# Patient Record
Sex: Female | Born: 1975 | Race: White | Hispanic: No | Marital: Married | Smoking: Former smoker
Health system: Southern US, Community
[De-identification: ages and names within clinical notes are randomized; demographics above are authoritative.]

## PROBLEM LIST (undated history)

## (undated) DIAGNOSIS — Z8481 Family history of carrier of genetic disease: Secondary | ICD-10-CM

## (undated) DIAGNOSIS — F32A Depression, unspecified: Secondary | ICD-10-CM

## (undated) DIAGNOSIS — N979 Female infertility, unspecified: Secondary | ICD-10-CM

## (undated) DIAGNOSIS — Z8041 Family history of malignant neoplasm of ovary: Secondary | ICD-10-CM

## (undated) DIAGNOSIS — J309 Allergic rhinitis, unspecified: Secondary | ICD-10-CM

## (undated) DIAGNOSIS — Z8042 Family history of malignant neoplasm of prostate: Secondary | ICD-10-CM

## (undated) DIAGNOSIS — Z803 Family history of malignant neoplasm of breast: Secondary | ICD-10-CM

## (undated) DIAGNOSIS — O34219 Maternal care for unspecified type scar from previous cesarean delivery: Secondary | ICD-10-CM

## (undated) DIAGNOSIS — F329 Major depressive disorder, single episode, unspecified: Secondary | ICD-10-CM

## (undated) HISTORY — DX: Family history of malignant neoplasm of breast: Z80.3

## (undated) HISTORY — DX: Allergic rhinitis, unspecified: J30.9

## (undated) HISTORY — DX: Family history of malignant neoplasm of ovary: Z80.41

## (undated) HISTORY — DX: Family history of carrier of genetic disease: Z84.81

## (undated) HISTORY — DX: Female infertility, unspecified: N97.9

## (undated) HISTORY — DX: Family history of malignant neoplasm of prostate: Z80.42

## (undated) HISTORY — DX: Depression, unspecified: F32.A

## (undated) HISTORY — DX: Major depressive disorder, single episode, unspecified: F32.9

---

## 2003-01-05 ENCOUNTER — Other Ambulatory Visit: Admission: RE | Admit: 2003-01-05 | Discharge: 2003-01-05 | Payer: Self-pay | Admitting: Obstetrics and Gynecology

## 2003-07-13 ENCOUNTER — Other Ambulatory Visit: Admission: RE | Admit: 2003-07-13 | Discharge: 2003-07-13 | Payer: Self-pay | Admitting: Obstetrics and Gynecology

## 2003-10-12 ENCOUNTER — Other Ambulatory Visit: Admission: RE | Admit: 2003-10-12 | Discharge: 2003-10-12 | Payer: Self-pay | Admitting: Obstetrics and Gynecology

## 2004-03-21 ENCOUNTER — Other Ambulatory Visit: Admission: RE | Admit: 2004-03-21 | Discharge: 2004-03-21 | Payer: Self-pay | Admitting: Obstetrics and Gynecology

## 2005-01-24 ENCOUNTER — Other Ambulatory Visit: Admission: RE | Admit: 2005-01-24 | Discharge: 2005-01-24 | Payer: Self-pay | Admitting: Obstetrics and Gynecology

## 2005-06-20 ENCOUNTER — Other Ambulatory Visit: Admission: RE | Admit: 2005-06-20 | Discharge: 2005-06-20 | Payer: Self-pay | Admitting: Obstetrics and Gynecology

## 2007-11-04 ENCOUNTER — Emergency Department (HOSPITAL_COMMUNITY): Admission: EM | Admit: 2007-11-04 | Discharge: 2007-11-04 | Payer: Self-pay | Admitting: Emergency Medicine

## 2008-04-14 ENCOUNTER — Emergency Department (HOSPITAL_COMMUNITY): Admission: EM | Admit: 2008-04-14 | Discharge: 2008-04-14 | Payer: Self-pay | Admitting: Family Medicine

## 2008-07-16 HISTORY — PX: KNEE SURGERY: SHX244

## 2008-12-07 ENCOUNTER — Encounter: Admission: RE | Admit: 2008-12-07 | Discharge: 2008-12-07 | Payer: Self-pay | Admitting: Obstetrics and Gynecology

## 2009-01-06 ENCOUNTER — Ambulatory Visit: Payer: Self-pay | Admitting: Internal Medicine

## 2009-01-06 DIAGNOSIS — J309 Allergic rhinitis, unspecified: Secondary | ICD-10-CM | POA: Insufficient documentation

## 2009-01-06 DIAGNOSIS — J069 Acute upper respiratory infection, unspecified: Secondary | ICD-10-CM | POA: Insufficient documentation

## 2009-01-06 HISTORY — DX: Allergic rhinitis, unspecified: J30.9

## 2009-03-14 ENCOUNTER — Ambulatory Visit (HOSPITAL_COMMUNITY): Admission: RE | Admit: 2009-03-14 | Discharge: 2009-03-14 | Payer: Self-pay | Admitting: Obstetrics and Gynecology

## 2009-08-12 ENCOUNTER — Ambulatory Visit: Payer: Self-pay | Admitting: Internal Medicine

## 2010-01-25 ENCOUNTER — Encounter: Admission: RE | Admit: 2010-01-25 | Discharge: 2010-01-26 | Payer: Self-pay | Admitting: Obstetrics and Gynecology

## 2010-03-22 ENCOUNTER — Inpatient Hospital Stay (HOSPITAL_COMMUNITY): Admission: AD | Admit: 2010-03-22 | Discharge: 2010-03-22 | Payer: Self-pay | Admitting: Obstetrics and Gynecology

## 2010-03-25 ENCOUNTER — Inpatient Hospital Stay (HOSPITAL_COMMUNITY): Admission: AD | Admit: 2010-03-25 | Discharge: 2010-03-27 | Payer: Self-pay | Admitting: Obstetrics and Gynecology

## 2010-03-25 ENCOUNTER — Encounter (INDEPENDENT_AMBULATORY_CARE_PROVIDER_SITE_OTHER): Payer: Self-pay | Admitting: Obstetrics and Gynecology

## 2010-05-25 ENCOUNTER — Ambulatory Visit: Payer: Self-pay | Admitting: Internal Medicine

## 2010-07-01 ENCOUNTER — Ambulatory Visit: Payer: Self-pay | Admitting: Family Medicine

## 2010-08-15 NOTE — Assessment & Plan Note (Signed)
Summary: FLU SHOT // RS  Nurse Visit Flu Vaccine Consent Questions     Do you have a history of severe allergic reactions to this vaccine? no    Any prior history of allergic reactions to egg and/or gelatin? no    Do you have a sensitivity to the preservative Thimersol? no    Do you have a past history of Guillan-Barre Syndrome? no    Do you currently have an acute febrile illness? no    Have you ever had a severe reaction to latex? no    Vaccine information given and explained to patient? yes    Are you currently pregnant? no    Lot Number:[id number]   Exp Date:01/13/2011   Site Given  Left Deltoid IM   Vital Signs:  Patient profile:   35 year old female Temp:     98.1 degrees F oral  Vitals Entered By: Duard Brady LPN (May 25, 2010 2:33 PM)  Allergies: 1)  ! Latex Exam Gloves (Disposable Gloves)  Orders Added: 1)  Admin 1st Vaccine [90471] 2)  Flu Vaccine 58yrs + [16109]

## 2010-08-15 NOTE — Assessment & Plan Note (Signed)
Summary: sinus inf/headaches/congestion/cjr   Vital Signs:  Patient profile:   35 year old female Weight:      168 pounds BMI:     26.41 Temp:     97.9 degrees F oral BP sitting:   102 / 72  (left arm) Cuff size:   regular  Vitals Entered By: Raechel Ache, RN (August 12, 2009 11:03 AM) CC: C/o head congestion , facial pressure and is [redacted] weeks pregnant. Is Patient Diabetic? No   CC:  C/o head congestion  and facial pressure and is [redacted] weeks pregnant..  History of Present Illness: 35 year old patient at [redacted] weeks gestation, who presents with a several-day history of cough, congestion, and achiness.  Denies any fever, chills, or shortness of breath.  No significant sore throat.  She has been using Afrin and her  baseline antihistamine  Allergies: 1)  ! Latex Exam Gloves (Disposable Gloves)  Past History:  Past Medical History: Reviewed history from 01/06/2009 and no changes required. Allergic rhinitis infertility  Family History: Reviewed history from 01/06/2009 and no changes required. father age 52, hypertension mother, age 86, history of hypertension, and DJD 3 brothers and two sisters are in good health  Review of Systems       The patient complains of anorexia, fever, and prolonged cough.  The patient denies weight loss, weight gain, vision loss, decreased hearing, hoarseness, chest pain, syncope, dyspnea on exertion, peripheral edema, headaches, hemoptysis, abdominal pain, melena, hematochezia, severe indigestion/heartburn, hematuria, incontinence, genital sores, muscle weakness, suspicious skin lesions, transient blindness, difficulty walking, depression, unusual weight change, abnormal bleeding, enlarged lymph nodes, angioedema, and breast masses.    Physical Exam  General:  Well-developed,well-nourished,in no acute distress; alert,appropriate and cooperative throughout examination; normal blood pressure Head:  Normocephalic and atraumatic without obvious  abnormalities. No apparent alopecia or balding. Eyes:  No corneal or conjunctival inflammation noted. EOMI. Perrla. Funduscopic exam benign, without hemorrhages, exudates or papilledema. Vision grossly normal. Ears:  External ear exam shows no significant lesions or deformities.  Otoscopic examination reveals clear canals, tympanic membranes are intact bilaterally without bulging, retraction, inflammation or discharge. Hearing is grossly normal bilaterally. Mouth:  Oral mucosa and oropharynx without lesions or exudates.  Teeth in good repair. Neck:  No deformities, masses, or tenderness noted. Lungs:  Normal respiratory effort, chest expands symmetrically. Lungs are clear to auscultation, no crackles or wheezes.   Impression & Recommendations:  Problem # 1:  URI (ICD-465.9)  Her updated medication list for this problem includes:    Xyzal 5 Mg Tabs (Levocetirizine dihydrochloride) .Marland Kitchen... 1 once daily  Problem # 2:  ALLERGIC RHINITIS (ICD-477.9)  Her updated medication list for this problem includes:    Xyzal 5 Mg Tabs (Levocetirizine dihydrochloride) .Marland Kitchen... 1 once daily  Complete Medication List: 1)  Xyzal 5 Mg Tabs (Levocetirizine dihydrochloride) .Marland Kitchen.. 1 once daily 2)  Pre-natal Formula Tabs (Prenatal multivit-min-fe-fa) .Marland Kitchen.. 1 once daily 3)  First-progesterone Vgs 50 50 Mg Supp (Progesterone) .Marland Kitchen.. 1 once daily  Patient Instructions: 1)  Get plenty of rest, drink lots of clear liquids, and use Tylenol  for fever and comfort. Return in 7-10 days if you're not better:sooner if you're feeling worse.

## 2010-08-17 NOTE — Assessment & Plan Note (Signed)
Summary: SINUSITIS // RS   Vital Signs:  Patient profile:   35 year old female Weight:      164 pounds BMI:     25.78 Temp:     98.0 degrees F oral BP sitting:   100 / 68  (left arm)  Vitals Entered By: Doristine Devoid CMA (July 01, 2010 9:19 AM) CC: sinus pressure and R ear pain  Comments not suer what she could take since patient is breast feeding   History of Present Illness: Patient seen with one-week history of sinus pressure frontal sinus region. Right ear fullness. Mild body aches. Denies any fever, nausea, vomiting, or cough.  no sore throat.  no ill exposures. She is breast-feeding 58-month-old.  Current Medications (verified): 1)  Xyzal 5 Mg Tabs (Levocetirizine Dihydrochloride) .Marland Kitchen.. 1 Once Daily 2)  Pre-Natal Formula  Tabs (Prenatal Multivit-Min-Fe-Fa) .Marland Kitchen.. 1 Once Daily 3)  First-Progesterone Vgs 50 50 Mg Supp (Progesterone) .Marland Kitchen.. 1 Once Daily  Allergies (verified): 1)  ! Latex Exam Gloves (Disposable Gloves)  Past History:  Past Medical History: Last updated: 01/06/2009 Allergic rhinitis infertility  Physical Exam  General:  Well-developed,well-nourished,in no acute distress; alert,appropriate and cooperative throughout examination Ears:  External ear exam shows no significant lesions or deformities.  Otoscopic examination reveals clear canals, tympanic membranes are intact bilaterally without bulging, retraction, inflammation or discharge. Hearing is grossly normal bilaterally. Nose:  External nasal examination shows no deformity or inflammation. Nasal mucosa are pink and moist without lesions or exudates. Mouth:  Oral mucosa and oropharynx without lesions or exudates.  Teeth in good repair. Neck:  No deformities, masses, or tenderness noted. Lungs:  Normal respiratory effort, chest expands symmetrically. Lungs are clear to auscultation, no crackles or wheezes. Heart:  Normal rate and regular rhythm. S1 and S2 normal without gallop, murmur, click, rub or other  extra sounds.   Impression & Recommendations:  Problem # 1:  URI (ICD-465.9) suspect viral. Over-the-counter meds such as Tylenol or ibuprofen for symptomatic relief follow up p.r.n. Her updated medication list for this problem includes:    Xyzal 5 Mg Tabs (Levocetirizine dihydrochloride) .Marland Kitchen... 1 once daily  Complete Medication List: 1)  Xyzal 5 Mg Tabs (Levocetirizine dihydrochloride) .Marland Kitchen.. 1 once daily 2)  Pre-natal Formula Tabs (Prenatal multivit-min-fe-fa) .Marland Kitchen.. 1 once daily 3)  First-progesterone Vgs 50 50 Mg Supp (Progesterone) .Marland Kitchen.. 1 once daily   Orders Added: 1)  Est. Patient Level III [16109]

## 2010-08-22 ENCOUNTER — Encounter: Payer: Self-pay | Admitting: Internal Medicine

## 2010-08-22 ENCOUNTER — Ambulatory Visit (INDEPENDENT_AMBULATORY_CARE_PROVIDER_SITE_OTHER): Payer: BC Managed Care – PPO | Admitting: Internal Medicine

## 2010-08-22 VITALS — BP 110/68 | Temp 98.4°F | Ht 66.5 in | Wt 163.0 lb

## 2010-08-22 DIAGNOSIS — J069 Acute upper respiratory infection, unspecified: Secondary | ICD-10-CM

## 2010-08-22 MED ORDER — LEVOCETIRIZINE DIHYDROCHLORIDE 5 MG PO TABS: 5.0000 mg | ORAL_TABLET | Freq: Every evening | ORAL | Status: DC

## 2010-08-22 NOTE — Progress Notes (Signed)
  Subjective:    Patient ID: Gabriela Bean, female    DOB: March 28, 1976, 35 y.o.   MRN: [id number]  HPI    35 year old patient who presents with a 3-day history of URI symptoms.  She is breast-feeding.  A9 month old son.  She feels fairly well but is concerned about worsening cough, which seems to startle her son.  There's been no sputum production, fever, chest pain or shortness of breath.  She does have a history of allergic rhinitis, but presently is taking no medications.  Due to her recent pregnancy and breast-feeding status.Review of Systems  Constitutional: Negative.   HENT: Positive for congestion. Negative for hearing loss, sore throat, rhinorrhea, dental problem, sinus pressure and tinnitus.   Eyes: Negative for pain, discharge and visual disturbance.  Respiratory: Positive for cough. Negative for shortness of breath.   Cardiovascular: Negative for chest pain, palpitations and leg swelling.  Gastrointestinal: Negative for nausea, vomiting, abdominal pain, diarrhea, constipation, blood in stool and abdominal distention.  Genitourinary: Negative for dysuria, urgency, frequency, hematuria, flank pain, vaginal bleeding, vaginal discharge, difficulty urinating, vaginal pain and pelvic pain.  Musculoskeletal: Negative for joint swelling, arthralgias and gait problem.  Skin: Negative for rash.  Neurological: Negative for dizziness, syncope, speech difficulty, weakness, numbness and headaches.  Hematological: Negative for adenopathy. Does not bruise/bleed easily.  Psychiatric/Behavioral: Negative for behavioral problems, dysphoric mood and agitation. The patient is not nervous/anxious.        Objective:   Physical Exam  Constitutional: She is oriented to person, place, and time. She appears well-developed and well-nourished.  HENT:  Head: Normocephalic.  Right Ear: External ear normal.  Left Ear: External ear normal.  Nose: Nose normal.  Mouth/Throat: Oropharynx is clear and moist.  Eyes:  Conjunctivae and EOM are normal. Pupils are equal, round, and reactive to light.  Neck: Normal range of motion. Neck supple. No thyromegaly present.  Cardiovascular: Normal rate, regular rhythm, normal heart sounds and intact distal pulses.   Pulmonary/Chest: Effort normal and breath sounds normal.  Abdominal: Soft. Bowel sounds are normal. She exhibits no mass. There is no tenderness.  Musculoskeletal: Normal range of motion.  Lymphadenopathy:    She has no cervical adenopathy.  Neurological: She is alert and oriented to person, place, and time.  Skin: Skin is warm and dry. No rash noted.  Psychiatric: She has a normal mood and affect. Her behavior is normal.          Assessment & Plan:  Viral URI-  Patient symptoms are mild and the patient is breast-feeding.  She will use Tylenol and Robitussin as needed only.  A refill for xyzal  Dispensed But will not resume until after weaning

## 2010-08-22 NOTE — Patient Instructions (Signed)
Get plenty of rest, Drink lots of  clear liquids, and use Tylenol or ibuprofen for fever and discomfort.    Call or return to clinic prn if these symptoms worsen or fail to improve as anticipated.  

## 2010-09-28 LAB — CBC
HCT: 24.7 % — ABNORMAL LOW (ref 36.0–46.0)
HCT: 31.3 % — ABNORMAL LOW (ref 36.0–46.0)
Hemoglobin: 10.8 g/dL — ABNORMAL LOW (ref 12.0–15.0)
MCH: 30.4 pg (ref 26.0–34.0)
MCH: 31.7 pg (ref 26.0–34.0)
MCHC: 34.4 g/dL (ref 30.0–36.0)
MCV: 90.6 fL (ref 78.0–100.0)
Platelets: 177 10*3/uL (ref 150–400)
RBC: 2.72 MIL/uL — ABNORMAL LOW (ref 3.87–5.11)
RDW: 14.3 % (ref 11.5–15.5)
WBC: 11.5 10*3/uL — ABNORMAL HIGH (ref 4.0–10.5)

## 2010-09-28 LAB — GLUCOSE, RANDOM: Glucose, Bld: 103 mg/dL — ABNORMAL HIGH (ref 70–99)

## 2011-04-27 ENCOUNTER — Telehealth: Payer: Self-pay | Admitting: Internal Medicine

## 2011-04-27 ENCOUNTER — Encounter: Payer: Self-pay | Admitting: Internal Medicine

## 2011-04-27 ENCOUNTER — Ambulatory Visit (INDEPENDENT_AMBULATORY_CARE_PROVIDER_SITE_OTHER): Payer: BC Managed Care – PPO | Admitting: Internal Medicine

## 2011-04-27 VITALS — BP 110/70 | Temp 98.6°F | Wt 149.0 lb

## 2011-04-27 DIAGNOSIS — K529 Noninfective gastroenteritis and colitis, unspecified: Secondary | ICD-10-CM

## 2011-04-27 DIAGNOSIS — K5289 Other specified noninfective gastroenteritis and colitis: Secondary | ICD-10-CM

## 2011-04-27 MED ORDER — DIPHENOXYLATE-ATROPINE 2.5-0.025 MG PO TABS: 1.0000 | ORAL_TABLET | Freq: Four times a day (QID) | ORAL | Status: AC | PRN

## 2011-04-27 MED ORDER — PROMETHAZINE HCL 25 MG PO TABS: 25.0000 mg | ORAL_TABLET | Freq: Four times a day (QID) | ORAL | Status: AC | PRN

## 2011-04-27 NOTE — Progress Notes (Signed)
  Subjective:    Patient ID: Gabriela Bean, female    DOB: 16-Feb-1976, 35 y.o.   MRN: [id number]  HPI  35 year old patient who had the onset of nausea vomiting and diarrhea last night. No nausea has resolved and she is tolerating liquids without difficulty. Her crampy abdominal pain also has resolved. Her diarrhea has improved and she has basically a loose stool every one or 2 hours. She has had no fever in general she feels reasonably well since her nausea has resolved. Her chief concern was possible exposure with a 35-month-old child    Review of Systems  Constitutional: Positive for fatigue. Negative for fever and chills.  Gastrointestinal: Positive for diarrhea. Negative for abdominal pain and abdominal distention. Nausea: resolved. Vomiting:  resolved.       Objective:   Physical Exam  Constitutional: She appears well-developed and well-nourished. No distress.  Abdominal: Soft. Bowel sounds are normal. She exhibits no distension. There is no tenderness. There is no rebound and no guarding.          Assessment & Plan:    Resolving viral gastroenteritis.  Meticulous handwashing discussed. She will be treated symptomatically

## 2011-04-27 NOTE — Telephone Encounter (Signed)
Not at this time - will call if any cx - pt only from office

## 2011-04-27 NOTE — Telephone Encounter (Signed)
Pt is scheduled for 3:45 today has vomiting and Nausea and would like to know if they can be worked in sooner. Please contact pt

## 2011-04-27 NOTE — Patient Instructions (Addendum)
The main concern with gastroenteritis is dehydration.  Drink  plenty of  fluids, and advance your diet  slowly to solids as you feel improved.  If you are unable to keep anything down or  Show  signs of dehydration such as dry, cracked lips, or  not urinating, please notify our office.  ALIGN  One daily  Call or return to clinic prn if these symptoms worsen or fail to improve as anticipated.

## 2012-02-08 LAB — OB RESULTS CONSOLE GC/CHLAMYDIA
Chlamydia: NEGATIVE
Gonorrhea: NEGATIVE

## 2012-02-08 LAB — OB RESULTS CONSOLE RPR: RPR: NONREACTIVE

## 2012-02-08 LAB — OB RESULTS CONSOLE HIV ANTIBODY (ROUTINE TESTING): HIV: NONREACTIVE

## 2012-06-06 ENCOUNTER — Ambulatory Visit (INDEPENDENT_AMBULATORY_CARE_PROVIDER_SITE_OTHER): Payer: BC Managed Care – PPO | Admitting: Family Medicine

## 2012-06-06 ENCOUNTER — Encounter: Payer: Self-pay | Admitting: Family Medicine

## 2012-06-06 VITALS — BP 102/64 | HR 107 | Temp 98.1°F | Wt 164.0 lb

## 2012-06-06 DIAGNOSIS — J069 Acute upper respiratory infection, unspecified: Secondary | ICD-10-CM

## 2012-06-06 NOTE — Patient Instructions (Addendum)
INSTRUCTIONS FOR UPPER RESPIRATORY INFECTION:  -plenty of rest and fluids  -nasal saline wash 2-3 times daily (use prepackaged nasal saline or bottled/distilled water if making your own)   -clean nose with nasal saline before using the nasal steroid or sinex  -can use sinex nasal spray for drainage and nasal congestion - but do NOT use longer then 3-4 days  -can use tylenol or ibuprofen as directed for aches and sorethroat  -in the winter time, using a humidifier at night is helpful (please follow cleaning instructions)  -if you are taking a cough medication - use only as directed, may also try throat lozenges  -for sore throat, salt water gargles can help  -follow up if you have fevers, facial pain, tooth pain, difficulty breathing or are worsening or not getting better in 5-7 days

## 2012-06-06 NOTE — Progress Notes (Signed)
Chief Complaint  Patient presents with  . head and nasal congestion    non productive cough x sunday     HPI: -started: 4 days ago -symptoms:nasal congestion, sore throat, drainage, scratchy throat, cough -denies:fever, SOB, VD, tooth pain, strep or mono exposure -has tried: benadryl and afrin - she is pregnant, saw ob today, ob told her to take pseudopod  -sick contacts: husband and son -Hx of: AR - seansonal  ROS: See pertinent positives and negatives per HPI.  Past Medical History  Diagnosis Date  . ALLERGIC RHINITIS 01/06/2009  . Infertility, female     No family history on file.  History   Social History  . Marital Status: Married    Spouse Name: N/A    Number of Children: N/A  . Years of Education: N/A   Social History Main Topics  . Smoking status: Former Smoker    Quit date: 07/16/2005  . Smokeless tobacco: None  . Alcohol Use: None  . Drug Use: None  . Sexually Active: None   Other Topics Concern  . None   Social History Narrative  . None    Current outpatient prescriptions:diphenhydrAMINE (SOMINEX) 25 MG tablet, Take 25 mg by mouth at bedtime as needed., Disp: , Rfl: ;  Prenat w/o A-FE-DSS-Methfol-FA (PRENATAL MULTIVITAMIN) 90-600-400 MG-MCG-MCG tablet, Take 1 tablet by mouth daily.  , Disp: , Rfl: ;  levocetirizine (XYZAL) 5 MG tablet, Take 1 tablet (5 mg total) by mouth every evening., Disp: 90 tablet, Rfl: 3 Progesterone (FIRST-PROGESTERONE VGS 50) 50 MG SUPP, Place 1 suppository vaginally daily.  , Disp: , Rfl:   EXAM:  Filed Vitals:   06/06/12 1601  BP: 102/64  Pulse: 107  Temp: 98.1 F (36.7 C)    There is no height on file to calculate BMI.  GENERAL: vitals reviewed and listed above, alert, oriented, appears well hydrated and in no acute distress  HEENT: atraumatic, conjunttiva clear, no obvious abnormalities on inspection of external nose and ears, normal appearance of ear canals and TMs, clear nasal congestion, mild post oropharyngeal  erythema with PND, no tonsillar edema or exudate, no sinus TTP  NECK: no obvious masses on inspection  LUNGS: clear to auscultation bilaterally, no wheezes, rales or rhonchi, good air movement  CV: HRRR, no peripheral edema  MS: moves all extremities without noticeable abnormality  PSYCH: pleasant and cooperative, no obvious depression or anxiety  ASSESSMENT AND PLAN:  Discussed the following assessment and plan:  1. Viral upper respiratory illness    -pregnant -Patient advised to return or notify a doctor immediately if symptoms worsen or persist or new concerns arise.  Patient Instructions  INSTRUCTIONS FOR UPPER RESPIRATORY INFECTION:  -plenty of rest and fluids  -nasal saline wash 2-3 times daily (use prepackaged nasal saline or bottled/distilled water if making your own)   -clean nose with nasal saline before using the nasal steroid or sinex  -can use sinex nasal spray for drainage and nasal congestion - but do NOT use longer then 3-4 days  -can use tylenol or ibuprofen as directed for aches and sorethroat  -in the winter time, using a humidifier at night is helpful (please follow cleaning instructions)  -if you are taking a cough medication - use only as directed, may also try throat lozenges  -for sore throat, salt water gargles can help  -follow up if you have fevers, facial pain, tooth pain, difficulty breathing or are worsening or not getting better in 5-7 days  Colin Benton R.

## 2012-07-16 HISTORY — DX: Maternal care for unspecified type scar from previous cesarean delivery: O34.219

## 2012-07-16 NOTE — L&D Delivery Note (Signed)
Delivery Note Pt feeling intense pressure, found to be C/C/+2-3, pushed x for delivery.  At 7:41 AM a viable and healthy female was delivered via VBAC, Spontaneous (Presentation: Right Occiput Anterior).  APGAR: 9, 9; weight .   Placenta status: Intact, Spontaneous.  Cord: 3 vessels with the following complications: Nuchal, loose  Anesthesia: Epidural  Episiotomy: None Lacerations: 2nd degree;Periurethral;Perineal Suture Repair: 3.0 vicryl rapide Est. Blood Loss (mL): 400cc  Mom to postpartum.  Baby to stay with mommy and daddy.  Bean,Gabriela Ventola 09/01/2012, 8:05 AM  O+/Br/ RI/ Contra - none

## 2012-08-15 LAB — OB RESULTS CONSOLE GBS: GBS: NEGATIVE

## 2012-08-25 ENCOUNTER — Ambulatory Visit (INDEPENDENT_AMBULATORY_CARE_PROVIDER_SITE_OTHER): Payer: BC Managed Care – PPO | Admitting: Internal Medicine

## 2012-08-25 ENCOUNTER — Encounter: Payer: Self-pay | Admitting: Internal Medicine

## 2012-08-25 VITALS — BP 120/70 | Temp 97.4°F | Wt 164.0 lb

## 2012-08-25 DIAGNOSIS — R0989 Other specified symptoms and signs involving the circulatory and respiratory systems: Secondary | ICD-10-CM

## 2012-08-25 DIAGNOSIS — J019 Acute sinusitis, unspecified: Secondary | ICD-10-CM

## 2012-08-25 DIAGNOSIS — R0609 Other forms of dyspnea: Secondary | ICD-10-CM

## 2012-08-25 DIAGNOSIS — J020 Streptococcal pharyngitis: Secondary | ICD-10-CM

## 2012-08-25 DIAGNOSIS — R0689 Other abnormalities of breathing: Secondary | ICD-10-CM

## 2012-08-25 LAB — POCT INFLUENZA A/B: Influenza B, POC: NEGATIVE

## 2012-08-25 MED ORDER — CEFUROXIME AXETIL 500 MG PO TABS: 500.0000 mg | ORAL_TABLET | Freq: Two times a day (BID) | ORAL | Status: DC

## 2012-08-25 MED ORDER — PREDNISONE 10 MG PO TABS: ORAL_TABLET | ORAL | Status: DC

## 2012-08-25 NOTE — Progress Notes (Signed)
  Subjective:    Patient ID: Gabriela Bean, female    DOB: [birth year], 37 y.o.   MRN: [id number]  Sinusitis This is a new problem. The current episode started in the past 7 days. The problem has been gradually worsening since onset. Maximum temperature: low grade. The pain is moderate. Associated symptoms include chills, congestion, coughing, a hoarse voice, sinus pressure and a sore throat. Pertinent negatives include no shortness of breath. (Bloody d/c) Past treatments include acetaminophen, saline sprays and spray decongestants. The treatment provided no relief.  C/o URI sx: can't breath at night due to sinus congestion Started Tamiflu on Sat Lost voice on Fri [redacted] wks pregnant    Review of Systems  Constitutional: Positive for chills.  HENT: Positive for congestion, sore throat, hoarse voice, rhinorrhea, postnasal drip and sinus pressure.   Respiratory: Positive for cough. Negative for shortness of breath.   Genitourinary: Negative for vaginal bleeding, vaginal discharge and pelvic pain.  Neurological: Negative for dizziness.       Objective:   Physical Exam  Constitutional: She appears well-developed. No distress.  NAD pregnant  HENT:  Head: Normocephalic.  Right Ear: External ear normal.  Left Ear: External ear normal.  Nose: Nose normal.  Mouth/Throat: Oropharynx is clear and moist.  Eyes: Conjunctivae are normal. Pupils are equal, round, and reactive to light. Right eye exhibits no discharge. Left eye exhibits no discharge.  eryth throat Very congested  Neck: Normal range of motion. Neck supple. No JVD present. No tracheal deviation present. No thyromegaly present.  Cardiovascular: Normal rate, regular rhythm and normal heart sounds.   Pulmonary/Chest: No stridor. No respiratory distress. She has no wheezes (normal).  Abdominal: Soft. Bowel sounds are normal. She exhibits distension. She exhibits no mass. There is no tenderness. There is no rebound and no guarding.   Musculoskeletal: She exhibits no edema and no tenderness.  Lymphadenopathy:    She has no cervical adenopathy.  Neurological: She displays normal reflexes. No cranial nerve deficit. She exhibits normal muscle tone. Coordination normal.  Skin: No rash noted. No erythema.  Psychiatric: She has a normal mood and affect. Her behavior is normal. Judgment and thought content normal.          Assessment & Plan:

## 2012-08-25 NOTE — Patient Instructions (Signed)
Voice rest 

## 2012-08-30 DIAGNOSIS — R0689 Other abnormalities of breathing: Secondary | ICD-10-CM | POA: Insufficient documentation

## 2012-08-30 DIAGNOSIS — J019 Acute sinusitis, unspecified: Secondary | ICD-10-CM | POA: Insufficient documentation

## 2012-08-30 NOTE — Assessment & Plan Note (Signed)
2/14 due to severe sinusitis in a pregnant woman Prednisone given - risks and benefits discussed

## 2012-08-30 NOTE — Assessment & Plan Note (Addendum)
2/14 severe in a pregnant woman Rx: Ceftin x 10 d Strep test neg Flu test neg - d/c Tamiflu

## 2012-09-01 ENCOUNTER — Encounter (HOSPITAL_COMMUNITY): Payer: Self-pay | Admitting: Anesthesiology

## 2012-09-01 ENCOUNTER — Inpatient Hospital Stay (HOSPITAL_COMMUNITY): Payer: BC Managed Care – PPO | Admitting: Anesthesiology

## 2012-09-01 ENCOUNTER — Encounter (HOSPITAL_COMMUNITY): Payer: Self-pay

## 2012-09-01 ENCOUNTER — Inpatient Hospital Stay (HOSPITAL_COMMUNITY)
Admission: AD | Admit: 2012-09-01 | Discharge: 2012-09-02 | DRG: 372 | Disposition: A | Discharge status: Home / Self Care | Payer: BC Managed Care – PPO | Source: Ambulatory Visit | Attending: Obstetrics and Gynecology | Admitting: Obstetrics and Gynecology

## 2012-09-01 DIAGNOSIS — O09529 Supervision of elderly multigravida, unspecified trimester: Secondary | ICD-10-CM | POA: Diagnosis present

## 2012-09-01 DIAGNOSIS — O4290 Premature rupture of membranes, unspecified as to length of time between rupture and onset of labor, unspecified weeks of gestation: Secondary | ICD-10-CM

## 2012-09-01 DIAGNOSIS — O99814 Abnormal glucose complicating childbirth: Secondary | ICD-10-CM | POA: Diagnosis present

## 2012-09-01 DIAGNOSIS — O34219 Maternal care for unspecified type scar from previous cesarean delivery: Secondary | ICD-10-CM

## 2012-09-01 LAB — CBC
MCH: 30.1 pg (ref 26.0–34.0)
MCHC: 33.9 g/dL (ref 30.0–36.0)
MCV: 88.9 fL (ref 78.0–100.0)
Platelets: 282 10*3/uL (ref 150–400)
RBC: 3.52 MIL/uL — ABNORMAL LOW (ref 3.87–5.11)

## 2012-09-01 LAB — TYPE AND SCREEN

## 2012-09-01 LAB — POCT FERN TEST: POCT Fern Test: POSITIVE

## 2012-09-01 MED ORDER — WITCH HAZEL-GLYCERIN EX PADS: 1.0000 "application " | MEDICATED_PAD | CUTANEOUS | Status: DC | PRN

## 2012-09-01 MED ORDER — ONDANSETRON HCL 4 MG/2ML IJ SOLN: 4.0000 mg | Freq: Four times a day (QID) | INTRAMUSCULAR | Status: DC | PRN

## 2012-09-01 MED ORDER — LIDOCAINE HCL (PF) 1 % IJ SOLN
30.0000 mL | INTRAMUSCULAR | Status: DC | PRN
  Filled 2012-09-01 (×2): qty 30

## 2012-09-01 MED ORDER — DIPHENHYDRAMINE HCL 50 MG/ML IJ SOLN: 12.5000 mg | INTRAMUSCULAR | Status: DC | PRN

## 2012-09-01 MED ORDER — OXYCODONE-ACETAMINOPHEN 5-325 MG PO TABS: 1.0000 | ORAL_TABLET | ORAL | Status: DC | PRN

## 2012-09-01 MED ORDER — LACTATED RINGERS IV SOLN
INTRAVENOUS | Status: DC
  Administered 2012-09-01 (×2): via INTRAVENOUS

## 2012-09-01 MED ORDER — TETANUS-DIPHTH-ACELL PERTUSSIS 5-2.5-18.5 LF-MCG/0.5 IM SUSP: 0.5000 mL | Freq: Once | INTRAMUSCULAR | Status: DC

## 2012-09-01 MED ORDER — ACETAMINOPHEN 325 MG PO TABS: 650.0000 mg | ORAL_TABLET | ORAL | Status: DC | PRN

## 2012-09-01 MED ORDER — PHENYLEPHRINE 40 MCG/ML (10ML) SYRINGE FOR IV PUSH (FOR BLOOD PRESSURE SUPPORT): 80.0000 ug | PREFILLED_SYRINGE | INTRAVENOUS | Status: DC | PRN

## 2012-09-01 MED ORDER — ZOLPIDEM TARTRATE 5 MG PO TABS: 5.0000 mg | ORAL_TABLET | Freq: Every evening | ORAL | Status: DC | PRN

## 2012-09-01 MED ORDER — SIMETHICONE 80 MG PO CHEW: 80.0000 mg | CHEWABLE_TABLET | ORAL | Status: DC | PRN

## 2012-09-01 MED ORDER — IBUPROFEN 600 MG PO TABS
600.0000 mg | ORAL_TABLET | Freq: Four times a day (QID) | ORAL | Status: DC
  Administered 2012-09-01 – 2012-09-02 (×3): 600 mg via ORAL
  Filled 2012-09-01 (×3): qty 1

## 2012-09-01 MED ORDER — ONDANSETRON HCL 4 MG PO TABS: 4.0000 mg | ORAL_TABLET | ORAL | Status: DC | PRN

## 2012-09-01 MED ORDER — CITRIC ACID-SODIUM CITRATE 334-500 MG/5ML PO SOLN: 30.0000 mL | ORAL | Status: DC | PRN

## 2012-09-01 MED ORDER — ONDANSETRON HCL 4 MG/2ML IJ SOLN: 4.0000 mg | INTRAMUSCULAR | Status: DC | PRN

## 2012-09-01 MED ORDER — IBUPROFEN 600 MG PO TABS: 600.0000 mg | ORAL_TABLET | Freq: Four times a day (QID) | ORAL | Status: DC | PRN

## 2012-09-01 MED ORDER — PRENATAL MULTIVITAMIN CH: 1.0000 | ORAL_TABLET | Freq: Every day | ORAL | Status: DC

## 2012-09-01 MED ORDER — TERBUTALINE SULFATE 1 MG/ML IJ SOLN: 0.2500 mg | Freq: Once | INTRAMUSCULAR | Status: DC | PRN

## 2012-09-01 MED ORDER — PRENATAL PLUS 27-1 MG PO TABS
1.0000 | ORAL_TABLET | Freq: Every day | ORAL | Status: DC
  Filled 2012-09-01 (×2): qty 1

## 2012-09-01 MED ORDER — SENNOSIDES-DOCUSATE SODIUM 8.6-50 MG PO TABS
2.0000 | ORAL_TABLET | Freq: Every day | ORAL | Status: DC
  Administered 2012-09-01: 2 via ORAL

## 2012-09-01 MED ORDER — EPHEDRINE 5 MG/ML INJ: 10.0000 mg | INTRAVENOUS | Status: DC | PRN

## 2012-09-01 MED ORDER — BENZOCAINE-MENTHOL 20-0.5 % EX AERO
1.0000 "application " | INHALATION_SPRAY | CUTANEOUS | Status: DC | PRN
  Administered 2012-09-01: 1 via TOPICAL
  Filled 2012-09-01: qty 56

## 2012-09-01 MED ORDER — SODIUM BICARBONATE 8.4 % IV SOLN
INTRAVENOUS | Status: DC | PRN
  Administered 2012-09-01: 5 mL via EPIDURAL

## 2012-09-01 MED ORDER — CEFUROXIME AXETIL 500 MG PO TABS
500.0000 mg | ORAL_TABLET | Freq: Two times a day (BID) | ORAL | Status: DC
  Administered 2012-09-01 – 2012-09-02 (×3): 500 mg via ORAL

## 2012-09-01 MED ORDER — CEFUROXIME AXETIL 500 MG PO TABS
500.0000 mg | ORAL_TABLET | Freq: Two times a day (BID) | ORAL | Status: DC
Start: 2012-09-01 — End: 2012-09-01

## 2012-09-01 MED ORDER — PHENYLEPHRINE 40 MCG/ML (10ML) SYRINGE FOR IV PUSH (FOR BLOOD PRESSURE SUPPORT)
80.0000 ug | PREFILLED_SYRINGE | INTRAVENOUS | Status: DC | PRN
  Filled 2012-09-01: qty 5

## 2012-09-01 MED ORDER — EPHEDRINE 5 MG/ML INJ
10.0000 mg | INTRAVENOUS | Status: DC | PRN
  Filled 2012-09-01: qty 4

## 2012-09-01 MED ORDER — FENTANYL 2.5 MCG/ML BUPIVACAINE 1/10 % EPIDURAL INFUSION (WH - ANES)
14.0000 mL/h | INTRAMUSCULAR | Status: DC
  Administered 2012-09-01: 14 mL/h via EPIDURAL
  Filled 2012-09-01: qty 125

## 2012-09-01 MED ORDER — LACTATED RINGERS IV SOLN
500.0000 mL | Freq: Once | INTRAVENOUS | Status: AC
  Administered 2012-09-01: 500 mL via INTRAVENOUS

## 2012-09-01 MED ORDER — DIBUCAINE 1 % RE OINT: 1.0000 "application " | TOPICAL_OINTMENT | RECTAL | Status: DC | PRN

## 2012-09-01 MED ORDER — OXYTOCIN 40 UNITS IN LACTATED RINGERS INFUSION - SIMPLE MED
1.0000 m[IU]/min | INTRAVENOUS | Status: DC
  Administered 2012-09-01: 2 m[IU]/min via INTRAVENOUS
  Filled 2012-09-01: qty 1000

## 2012-09-01 MED ORDER — OXYTOCIN BOLUS FROM INFUSION
500.0000 mL | INTRAVENOUS | Status: DC
  Administered 2012-09-01: 500 mL via INTRAVENOUS

## 2012-09-01 MED ORDER — PRENATAL MULTIVITAMIN CH
1.0000 | ORAL_TABLET | Freq: Every day | ORAL | Status: DC
  Administered 2012-09-01 – 2012-09-02 (×2): 1 via ORAL

## 2012-09-01 MED ORDER — LANOLIN HYDROUS EX OINT: TOPICAL_OINTMENT | CUTANEOUS | Status: DC | PRN

## 2012-09-01 MED ORDER — LACTATED RINGERS IV SOLN
500.0000 mL | INTRAVENOUS | Status: DC | PRN
  Administered 2012-09-01: 500 mL via INTRAVENOUS

## 2012-09-01 MED ORDER — DIPHENHYDRAMINE HCL 25 MG PO CAPS: 25.0000 mg | ORAL_CAPSULE | Freq: Four times a day (QID) | ORAL | Status: DC | PRN

## 2012-09-01 MED ORDER — LACTATED RINGERS IV SOLN: INTRAVENOUS | Status: DC

## 2012-09-01 MED ORDER — OXYTOCIN 40 UNITS IN LACTATED RINGERS INFUSION - SIMPLE MED: 62.5000 mL/h | INTRAVENOUS | Status: DC

## 2012-09-01 NOTE — Anesthesia Postprocedure Evaluation (Signed)
  Anesthesia Post-op Note  Patient: Gabriela Bean  Procedure(s) Performed: * No procedures listed *  Patient Location: Mother/Baby  Anesthesia Type:Epidural  Level of Consciousness: awake, alert  and oriented  Airway and Oxygen Therapy: Patient Spontanous Breathing  Post-op Pain: none  Post-op Assessment: Post-op Vital signs reviewed, Patient's Cardiovascular Status Stable, No headache, No backache, No residual numbness and No residual motor weakness  Post-op Vital Signs: Reviewed and stable  Complications: No apparent anesthesia complications

## 2012-09-01 NOTE — Progress Notes (Signed)
Patient ID: Gabriela Bean, female   DOB: July 03, 1976, 37 y.o.   MRN: [id number]  Pt feels pressure  AFVSS gen NAD FHTs 140's with earlies, mod var toco irr  SVE 9.5/100/+1  Will recheck and start pushing soon

## 2012-09-01 NOTE — Anesthesia Preprocedure Evaluation (Signed)

## 2012-09-01 NOTE — H&P (Signed)
Gabriela Bean is a 37 y.o. female G2P1001 at 38+ with ROM.  Pt desires TOLAC.  D/w pt r/b/a of VBAC.  ROM at 12:15 for clear fluid. +FM, no VB, rare ctx.  Pregnancy complicated by h/o LTCS, S<D, AMA, Pregnancy with IUI and clomid.  H/o GDM with 2 normal glucola.   Maternal Medical History:  Reason for admission: Rupture of membranes.   Contractions: Frequency: rare.    Fetal activity: Perceived fetal activity is normal.      OB History   Grav Para Term Preterm Abortions TAB SAB Ect Mult Living   2 1 1       1     G1 failed VAVD, LTCS - 7#7 G2 present H/o abn pap -LEEP, nl with pregnancy No STDs, h/o HPV H/o infertility - clomid and IUI  Past Medical History  Diagnosis Date  . ALLERGIC RHINITIS 01/06/2009  . Infertility, female   anxiety Familial hematuria   Past Surgical History  Procedure Laterality Date  . Cesarean section  03/25/10  . Knee surgery Left 2010    torn NCL  LEEP, WTE Family History: family history includes Cancer in her paternal grandfather and paternal grandmother; Diabetes in her mother; and Hypertension in her father and mother. Social History:  reports that she quit smoking about 7 years ago. She does not have any smokeless tobacco history on file. 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   HENT: Negative.   Eyes: Negative.   Respiratory: Negative.   Cardiovascular: Negative.   Gastrointestinal: Negative.   Genitourinary: Negative.   Musculoskeletal:  Negative.   Skin: Negative.   Neurological: Negative.   Psychiatric/Behavioral: Negative.       Blood pressure 112/73, pulse 112, temperature 97.6 F (36.4 C), temperature source Oral, resp. rate 18, height 5\' 7"  (1.702 m), weight 77.111 kg (170 lb). Maternal Exam:  Uterine Assessment: Contraction frequency is rare.   Abdomen: Surgical scars: low transverse.   Fundal height is S < D.   Estimated fetal weight is 6-7#.   Fetal presentation: vertex  Introitus: Normal vulva. Normal vagina.  Ferning test: positive.   Pelvis: questionable for delivery.   Cervix: Cervix evaluated by digital exam.     Physical Exam  Constitutional: She is oriented to person, place, and time. She appears well-developed and well-nourished.  HENT:  Head: Normocephalic and atraumatic.  Cardiovascular: Normal rate and regular rhythm.   Respiratory: Effort normal and breath sounds normal. No respiratory distress.  GI: Soft. Bowel sounds are normal. There is no tenderness.  Musculoskeletal: Normal range of motion.  Neurological: She is alert and oriented to person, place, and time.  Skin: Skin is warm and dry.  Psychiatric: She has a normal mood and affect. Her behavior is normal.    Prenatal labs: ABO, Rh: O/Positive/-- (07/26 0000) Antibody: Negative (07/26 0000) Rubella: Immune (07/26 0000) RPR: Nonreactive (07/26 0000)  HBsAg: Negative (07/26 0000)  HIV: Non-reactive (07/26 0000)  GBS: Negative (01/31 0000)  Hgb 11.6/ Pap WNL, HR HPV neg/ Enteroccocus UTI, TOC neg/ Plt 302K/ GC neg/ Chl neg/  glucola 102/111/ gbbs neg  Tdap 12/10  [place] EDC 2/26 cwd [place] nl anat, isolated EIF/female, fundal plac  Assessment/Plan: 37yo G2P1001 at 38+ with ROM, D/W pt r/b/a of VBAC Will start pitocin gbbs neg, no prophylaxis Poss SVD   BOVARD,Jinger Middlesworth 09/01/2012, 3:14 AM

## 2012-09-01 NOTE — Anesthesia Procedure Notes (Signed)

## 2012-09-02 LAB — CBC
HCT: 26.7 % — ABNORMAL LOW (ref 36.0–46.0)
MCHC: 33.7 g/dL (ref 30.0–36.0)
MCV: 89.6 fL (ref 78.0–100.0)
RDW: 13.7 % (ref 11.5–15.5)

## 2012-09-02 MED ORDER — PRENATAL PLUS 27-1 MG PO TABS: 1.0000 | ORAL_TABLET | Freq: Every day | ORAL | Status: DC

## 2012-09-02 MED ORDER — IBUPROFEN 800 MG PO TABS: 800.0000 mg | ORAL_TABLET | Freq: Three times a day (TID) | ORAL | Status: DC | PRN

## 2012-09-02 MED ORDER — OXYCODONE-ACETAMINOPHEN 5-325 MG PO TABS: 1.0000 | ORAL_TABLET | Freq: Four times a day (QID) | ORAL | Status: DC | PRN

## 2012-09-02 NOTE — Discharge Summary (Signed)
Obstetric Discharge Summary Reason for Admission: onset of labor and rupture of membranes Prenatal Procedures: none Intrapartum Procedures: spontaneous vaginal delivery, VBAC Postpartum Procedures: none Complications-Operative and Postpartum: 2nd degree perineal laceration and vaginal laceration Hemoglobin  Date Value Range Status  09/02/2012 9.0* 12.0 - 15.0 g/dL Final     HCT  Date Value Range Status  09/02/2012 26.7* 36.0 - 46.0 % Final    Physical Exam:  General: alert and no distress Lochia: appropriate Uterine Fundus: firm  Discharge Diagnoses: Term Pregnancy-delivered  Discharge Information: Date: 09/02/2012 Activity: pelvic rest Diet: routine Medications: PNV, Ibuprofen and Percocet Condition: stable Instructions: refer to practice specific booklet Discharge to: home Follow-up Information   Follow up with BOVARD,Katherine Syme, MD. Schedule an appointment as soon as possible for a visit in 6 weeks.   Contact information:   [address] [place] [address] [phone]       Newborn Data: Live born female  Birth Weight: 6 lb 8.8 oz (2970 g) APGAR: 9, 9  Home with mother.  BOVARD,Rocket Gunderson 09/02/2012, 8:55 AM

## 2012-09-02 NOTE — Progress Notes (Addendum)
Post Partum Day 1 Subjective: no complaints, up ad lib, tolerating PO and nl lochia, pain controlled.    Objective: Blood pressure 114/69, pulse 91, temperature 98.1 F (36.7 C), temperature source Oral, resp. rate 18, height 5\' 7"  (1.702 m), weight 77.111 kg (170 lb), SpO2 97.00%, unknown if currently breastfeeding.  Physical Exam:  General: alert and no distress Lochia: appropriate Uterine Fundus: firm   Recent Labs  09/01/12 0300 09/02/12 0530  HGB 10.6* 9.0*  HCT 31.3* 26.7*    Assessment/Plan: Plan for discharge tomorrow, Breastfeeding and Lactation consult.  Doing well, routine care. At pt's request will d/c today, if OK with peds.      LOS: 1 day   BOVARD,Audiel Scheiber 09/02/2012, 8:39 AM

## 2012-09-08 ENCOUNTER — Telehealth (HOSPITAL_COMMUNITY): Payer: Self-pay | Admitting: *Deleted

## 2012-09-08 NOTE — Telephone Encounter (Signed)
Resolve episode 

## 2012-09-17 ENCOUNTER — Encounter (HOSPITAL_COMMUNITY)
Admission: AD | Disposition: A | Discharge status: Home / Self Care | Payer: Self-pay | Source: Ambulatory Visit | Attending: Obstetrics and Gynecology

## 2012-09-17 SURGERY — Surgical Case
Anesthesia: Choice

## 2012-12-18 ENCOUNTER — Ambulatory Visit: Payer: BC Managed Care – PPO | Admitting: Family Medicine

## 2013-01-02 ENCOUNTER — Ambulatory Visit (INDEPENDENT_AMBULATORY_CARE_PROVIDER_SITE_OTHER): Payer: BC Managed Care – PPO | Admitting: Internal Medicine

## 2013-01-02 ENCOUNTER — Encounter: Payer: Self-pay | Admitting: Internal Medicine

## 2013-01-02 VITALS — BP 100/64 | HR 71 | Temp 98.5°F | Resp 18 | Wt 154.0 lb

## 2013-01-02 DIAGNOSIS — K219 Gastro-esophageal reflux disease without esophagitis: Secondary | ICD-10-CM

## 2013-01-02 DIAGNOSIS — K5900 Constipation, unspecified: Secondary | ICD-10-CM

## 2013-01-02 NOTE — Progress Notes (Signed)
Subjective:    Patient ID: Gabriela Bean, female    DOB: 09-16-1975, 37 y.o.   MRN: [id number]  HPI  37 year old patient who is 4 months postpartum. She is doing quite well but is breast-feeding and presents today with both reflux and constipation concerns. She had a significant issue with constipation with her first pregnancy but this resolved in the postpartum period. Her most recent pregnancy was associated with only mild reflux that seems to have worsened in the postpartum period. During her initial pregnancy she was given Nexium.  PDR reviewed for both Nexium and H2 blockers and both are secreted in  breast milk and are not recommended  Past Medical History  Diagnosis Date  . ALLERGIC RHINITIS 01/06/2009  . Infertility, female   . VBAC, delivered, current hospitalization 09/01/2012    History   Social History  . Marital Status: Married    Spouse Name: N/A    Number of Children: N/A  . Years of Education: N/A   Occupational History  . Not on file.   Social History Main Topics  . Smoking status: Former Smoker    Quit date: 07/16/2005  . Smokeless tobacco: Not on file  . Alcohol Use: No  . Drug Use: No  . Sexually Active: Yes   Other Topics Concern  . Not on file   Social History Narrative  . No narrative on file    Past Surgical History  Procedure Laterality Date  . Cesarean section  03/25/10  . Knee surgery Left 2010    torn NCL    Family History  Problem Relation Age of Onset  . Diabetes Mother   . Hypertension Mother   . Hypertension Father   . Cancer Paternal Grandmother   . Cancer Paternal Grandfather     Allergies  Allergen Reactions  . Pseudoephedrine   . Latex     Current Outpatient Prescriptions on File Prior to Visit  Medication Sig Dispense Refill  . prenatal vitamin w/FE, FA (PRENATAL 1 + 1) 27-1 MG TABS Take 1 tablet by mouth daily.  30 each  12   No current facility-administered medications on file prior to visit.    BP 100/64   Pulse 71  Temp(Src) 98.5 F (36.9 C) (Oral)  Resp 18  Wt 154 lb (69.854 kg)  BMI 24.11 kg/m2  SpO2 98%  Breastfeeding? Yes       Review of Systems  Gastrointestinal: Positive for abdominal pain and constipation.       Objective:   Physical Exam  Constitutional: She is oriented to person, place, and time. She appears well-developed and well-nourished.  HENT:  Head: Normocephalic.  Right Ear: External ear normal.  Left Ear: External ear normal.  Mouth/Throat: Oropharynx is clear and moist.  Eyes: Conjunctivae and EOM are normal. Pupils are equal, round, and reactive to light.  Neck: Normal range of motion. Neck supple. No thyromegaly present.  Cardiovascular: Normal rate, regular rhythm, normal heart sounds and intact distal pulses.   Pulmonary/Chest: Effort normal and breath sounds normal.  Abdominal: Soft. Bowel sounds are normal. She exhibits no mass. There is no tenderness.  Musculoskeletal: Normal range of motion.  Lymphadenopathy:    She has no cervical adenopathy.  Neurological: She is alert and oriented to person, place, and time.  Skin: Skin is warm and dry. No rash noted.  Psychiatric: She has a normal mood and affect. Her behavior is normal.          Assessment & Plan:  GERD.  We'll start on an aggressive anti-reflex regimen. Do to her lactating status will hold off on medications at this time.  Suspect that reflux symptoms will resolve following cessation of lactation  Constipation.  This also discussed at length. Information given to the patient.  We'll try to become more active with a very liberal fluid and fiber intake

## 2013-01-02 NOTE — Patient Instructions (Signed)
Avoids foods high in acid such as tomatoes citrus juices, and spicy foods.  Avoid eating within two hours of lying down or before exercising.  Do not overheat.  Try smaller more frequent meals.  If symptoms persist, elevate the head of her bed 12 inches while sleeping. Diet for Gastroesophageal Reflux Disease, Adult Reflux (acid reflux) is when acid from your stomach flows up into the esophagus. When acid comes in contact with the esophagus, the acid causes irritation and soreness (inflammation) in the esophagus. When reflux happens often or so severely that it causes damage to the esophagus, it is called gastroesophageal reflux disease (GERD). Nutrition therapy can help ease the discomfort of GERD. FOODS OR DRINKS TO AVOID OR LIMIT  Smoking or chewing tobacco. Nicotine is one of the most potent stimulants to acid production in the gastrointestinal tract.  Caffeinated and decaffeinated coffee and black tea.  Regular or low-calorie carbonated beverages or energy drinks (caffeine-free carbonated beverages are allowed).   Strong spices, such as black pepper, white pepper, red pepper, cayenne, curry powder, and chili powder.  Peppermint or spearmint.  Chocolate.  High-fat foods, including meats and fried foods. Extra added fats including oils, butter, salad dressings, and nuts. Limit these to less than 8 tsp per day.  Fruits and vegetables if they are not tolerated, such as citrus fruits or tomatoes.  Alcohol.  Any food that seems to aggravate your condition. If you have questions regarding your diet, call your caregiver or a registered dietitian. OTHER THINGS THAT MAY HELP GERD INCLUDE:   Eating your meals slowly, in a relaxed setting.  Eating 5 to 6 small meals per day instead of 3 large meals.  Eliminating food for a period of time if it causes distress.  Not lying down until 3 hours after eating a meal.  Keeping the head of your bed raised 6 to 9 inches (15 to 23 cm) by using a  foam wedge or blocks under the legs of the bed. Lying flat may make symptoms worse.  Being physically active. Weight loss may be helpful in reducing reflux in overweight or obese adults.  Wear loose fitting clothing EXAMPLE MEAL PLAN This meal plan is approximately 2,000 calories based on https://www.bernard.org/ meal planning guidelines. Breakfast   cup cooked oatmeal.  1 cup strawberries.  1 cup low-fat milk.  1 oz almonds. Snack  1 cup cucumber slices.  6 oz yogurt (made from low-fat or fat-free milk). Lunch  2 slice whole-wheat bread.  2 oz sliced [place].  2 tsp mayonnaise.  1 cup blueberries.  1 cup snap peas. Snack  6 whole-wheat crackers.  1 oz string cheese. Dinner   cup brown rice.  1 cup mixed veggies.  1 tsp olive oil.  3 oz grilled fish. Document Released: 07/02/2005 Document Revised: 09/24/2011 Document Reviewed: 05/18/2011 [facility] Patient Information 2014 [place], [place]. Gastroesophageal Reflux Disease, Adult Gastroesophageal reflux disease (GERD) happens when acid from your stomach flows up into the esophagus. When acid comes in contact with the esophagus, the acid causes soreness (inflammation) in the esophagus. Over time, GERD may create small holes (ulcers) in the lining of the esophagus. CAUSES   Increased body weight. This puts pressure on the stomach, making acid rise from the stomach into the esophagus.  Smoking. This increases acid production in the stomach.  Drinking alcohol. This causes decreased pressure in the lower esophageal sphincter (valve or ring of muscle between the esophagus and stomach), allowing acid from the stomach into the esophagus.  Late evening meals and a full stomach. This increases pressure and acid production in the stomach.  A malformed lower esophageal sphincter. Sometimes, no cause is found. SYMPTOMS   Burning pain in the lower part of the mid-chest behind the breastbone and in the mid-stomach area. This  may occur twice a week or more often.  Trouble swallowing.  Sore throat.  Dry cough.  Asthma-like symptoms including chest tightness, shortness of breath, or wheezing. DIAGNOSIS  Your caregiver may be able to diagnose GERD based on your symptoms. In some cases, X-rays and other tests may be done to check for complications or to check the condition of your stomach and esophagus. TREATMENT  Your caregiver may recommend over-the-counter or prescription medicines to help decrease acid production. Ask your caregiver before starting or adding any new medicines.  HOME CARE INSTRUCTIONS   Change the factors that you can control. Ask your caregiver for guidance concerning weight loss, quitting smoking, and alcohol consumption.  Avoid foods and drinks that make your symptoms worse, such as:  Caffeine or alcoholic drinks.  Chocolate.  Peppermint or mint flavorings.  Garlic and onions.  Spicy foods.  Citrus fruits, such as oranges, lemons, or limes.  Tomato-based foods such as sauce, chili, salsa, and pizza.  Fried and fatty foods.  Avoid lying down for the 3 hours prior to your bedtime or prior to taking a nap.  Eat small, frequent meals instead of large meals.  Wear loose-fitting clothing. Do not wear anything tight around your waist that causes pressure on your stomach.  Raise the head of your bed 6 to 8 inches with wood blocks to help you sleep. Extra pillows will not help.  Only take over-the-counter or prescription medicines for pain, discomfort, or fever as directed by your caregiver.  Do not take aspirin, ibuprofen, or other nonsteroidal anti-inflammatory drugs (NSAIDs). SEEK IMMEDIATE MEDICAL CARE IF:   You have pain in your arms, neck, jaw, teeth, or back.  Your pain increases or changes in intensity or duration.  You develop nausea, vomiting, or sweating (diaphoresis).  You develop shortness of breath, or you faint.  Your vomit is green, yellow, black, or looks  like coffee grounds or blood.  Your stool is red, bloody, or black. These symptoms could be signs of other problems, such as heart disease, gastric bleeding, or esophageal bleeding. MAKE SURE YOU:   Understand these instructions.  Will watch your condition.  Will get help right away if you are not doing well or get worse. Document Released: 04/11/2005 Document Revised: 09/24/2011 Document Reviewed: 01/19/2011 [facility] Patient Information 2014 [place], [place]. Constipation, Adult Constipation is when a person:  Poops (bowel movement) less than 3 times a week.  Has a hard time pooping.  Has poop that is dry, hard, or bigger than normal. HOME CARE   Eat more fiber, such as fruits, vegetables, whole grains like brown rice, and beans.  Eat less fatty foods and sugar. This includes [place] fries, hamburgers, cookies, candy, and soda.  If you are not getting enough fiber from food, take products with added fiber in them (supplements).  Drink enough fluid to keep your pee (urine) clear or pale yellow.  Go to the restroom when you feel like you need to poop. Do not hold it.  Only take medicine as told by your doctor. Do not take medicines that help you poop (laxatives) without talking to your doctor first.  Exercise on a regular basis, or as told by your doctor. GET HELP  RIGHT AWAY IF:   You have bright red blood in your poop (stool).  Your constipation lasts more than 4 days or gets worse.  You have belly (abdomen) or butt (rectal) pain.  You have thin poop (as thin as a pencil).  You lose weight, and it cannot be explained. MAKE SURE YOU:   Understand these instructions.  Will watch your condition.  Will get help right away if you are not doing well or get worse. Document Released: 12/19/2007 Document Revised: 09/24/2011 Document Reviewed: 06/05/2011 [facility] Patient Information 2014 [place], [place]. High-Fiber Diet Fiber is found in fruits, vegetables, and grains. A  high-fiber diet encourages the addition of more whole grains, legumes, fruits, and vegetables in your diet. The recommended amount of fiber for adult males is 38 g per day. For adult females, it is 25 g per day. Pregnant and lactating women should get 28 g of fiber per day. If you have a digestive or bowel problem, ask your caregiver for advice before adding high-fiber foods to your diet. Eat a variety of high-fiber foods instead of only a select few type of foods.  PURPOSE  To increase stool bulk.  To make bowel movements more regular to prevent constipation.  To lower cholesterol.  To prevent overeating. WHEN IS THIS DIET USED?  It may be used if you have constipation and hemorrhoids.  It may be used if you have uncomplicated diverticulosis (intestine condition) and irritable bowel syndrome.  It may be used if you need help with weight management.  It may be used if you want to add it to your diet as a protective measure against atherosclerosis, diabetes, and cancer. SOURCES OF FIBER  Whole-grain breads and cereals.  Fruits, such as apples, oranges, bananas, berries, prunes, and pears.  Vegetables, such as green peas, carrots, sweet potatoes, beets, broccoli, cabbage, spinach, and artichokes.  Legumes, such split peas, soy, lentils.  Almonds. FIBER CONTENT IN FOODS Starches and Grains / Dietary Fiber (g)  Cheerios, 1 cup / 3 g  Corn Flakes cereal, 1 cup / 0.7 g  Rice crispy treat cereal, 1 cup / 0.3 g  Instant oatmeal (cooked),  cup / 2 g  Frosted wheat cereal, 1 cup / 5.1 g  Brown, long-grain rice (cooked), 1 cup / 3.5 g  White, long-grain rice (cooked), 1 cup / 0.6 g  Enriched macaroni (cooked), 1 cup / 2.5 g Legumes / Dietary Fiber (g)  Baked beans (canned, plain, or vegetarian),  cup / 5.2 g  Kidney beans (canned),  cup / 6.8 g  Pinto beans (cooked),  cup / 5.5 g Breads and Crackers / Dietary Fiber (g)  Plain or honey graham crackers, 2 squares /  0.7 g  Saltine crackers, 3 squares / 0.3 g  Plain, salted pretzels, 10 pieces / 1.8 g  Whole-wheat bread, 1 slice / 1.9 g  White bread, 1 slice / 0.7 g  Raisin bread, 1 slice / 1.2 g  Plain bagel, 3 oz / 2 g  Flour tortilla, 1 oz / 0.9 g  Corn tortilla, 1 small / 1.5 g  Hamburger or hotdog bun, 1 small / 0.9 g Fruits / Dietary Fiber (g)  Apple with skin, 1 medium / 4.4 g  Sweetened applesauce,  cup / 1.5 g  Banana,  medium / 1.5 g  Grapes, 10 grapes / 0.4 g  Orange, 1 small / 2.3 g  Raisin, 1.5 oz / 1.6 g  Melon, 1 cup / 1.4 g Vegetables /  Dietary Fiber (g)  Green beans (canned),  cup / 1.3 g  Carrots (cooked),  cup / 2.3 g  Broccoli (cooked),  cup / 2.8 g  Peas (cooked),  cup / 4.4 g  Mashed potatoes,  cup / 1.6 g  Lettuce, 1 cup / 0.5 g  Corn (canned),  cup / 1.6 g  Tomato,  cup / 1.1 g Document Released: 07/02/2005 Document Revised: 01/01/2012 Document Reviewed: 10/04/2011 [facility] Patient Information 2014 [place], [place].

## 2013-04-15 ENCOUNTER — Encounter: Payer: Self-pay | Admitting: Family Medicine

## 2013-04-15 ENCOUNTER — Ambulatory Visit (INDEPENDENT_AMBULATORY_CARE_PROVIDER_SITE_OTHER): Payer: BC Managed Care – PPO | Admitting: Family Medicine

## 2013-04-15 VITALS — BP 100/70 | Temp 98.1°F | Wt 154.0 lb

## 2013-04-15 DIAGNOSIS — M545 Low back pain: Secondary | ICD-10-CM

## 2013-04-15 DIAGNOSIS — R209 Unspecified disturbances of skin sensation: Secondary | ICD-10-CM

## 2013-04-15 DIAGNOSIS — IMO0002 Reserved for concepts with insufficient information to code with codable children: Secondary | ICD-10-CM

## 2013-04-15 DIAGNOSIS — M5410 Radiculopathy, site unspecified: Secondary | ICD-10-CM

## 2013-04-15 DIAGNOSIS — R202 Paresthesia of skin: Secondary | ICD-10-CM

## 2013-04-15 NOTE — Patient Instructions (Signed)
-  do exercises provided  follow up with your doctor in 4 weeks or sooner if worsening ro concerns

## 2013-04-15 NOTE — Progress Notes (Signed)
Chief Complaint  Patient presents with  . left leg numbness    about 2 weeks    HPI:  Acute visit for leg tingling: -intermittent -started a few weeks ago after running and twisting ankle then went away -mild tingling sensation in L lowe back radiating in L buttock into upper post thigh -then noticed yesterday and is there today a little -she reports chronic low back issues and hx sciatic nerve issues on R, reports hx MRI in past with mild DDD -denies: fevers, malaise, weakness, numbness, bowel or bladder incontinence  ROS: See pertinent positives and negatives per HPI.  Past Medical History  Diagnosis Date  . ALLERGIC RHINITIS 01/06/2009  . Infertility, female   . VBAC, delivered, current hospitalization 09/01/2012    Past Surgical History  Procedure Laterality Date  . Cesarean section  03/25/10  . Knee surgery Left 2010    torn NCL    Family History  Problem Relation Age of Onset  . Diabetes Mother   . Hypertension Mother   . Hypertension Father   . Cancer Paternal Grandmother   . Cancer Paternal Grandfather     History   Social History  . Marital Status: Married    Spouse Name: N/A    Number of Children: N/A  . Years of Education: N/A   Social History Main Topics  . Smoking status: Former Smoker    Quit date: 07/16/2005  . Smokeless tobacco: None  . Alcohol Use: No  . Drug Use: No  . Sexual Activity: Yes   Other Topics Concern  . None   Social History Narrative  . None    Current outpatient prescriptions:prenatal vitamin w/FE, FA (PRENATAL 1 + 1) 27-1 MG TABS, Take 1 tablet by mouth daily., Disp: 30 each, Rfl: 12;  acetaminophen (TYLENOL) 325 MG tablet, Take 650 mg by mouth every 6 (six) hours as needed for pain., Disp: , Rfl:   EXAM:  Filed Vitals:   04/15/13 1004  BP: 100/70  Temp: 98.1 F (36.7 C)    Body mass index is 24.11 kg/(m^2).  GENERAL: vitals reviewed and listed above, alert, oriented, appears well hydrated and in no acute  distress  HEENT: atraumatic, conjunttiva clear, no obvious abnormalities on inspection of external nose and ears  NECK: no obvious masses on inspection  LUNGS: clear to auscultation bilaterally, no wheezes, rales or rhonchi, good air movement  CV: HRRR, no peripheral edema  MS: moves all extremities without noticeable abnormality -Normal Gait Normal inspection of back, no obvious scoliosis or leg length descrepancy No bony TTP Soft tissue TTP at: -/+ tests: neg trendelenburg,-facet loading, -SLRT, -CLRT, -FABER, -FADIR Normal muscle strength, sensation to light touch and vibration,  DTRs 1+ but equal in LEs bilaterally   PSYCH: pleasant and cooperative, no obvious depression or anxiety  ASSESSMENT AND PLAN:  Discussed the following assessment and plan:  Radicular neuropathy  Low back pain  Paresthesia  -mild symptoms c/w with likely mild radiculopathy related likely to her reported lumbosacral DDD; discussed other less likely potential etiologies -discussed options and will started with conservative home exercise program with follow up with PCP in 4 weeks to ensure resolving  -Patient advised to return or notify a doctor immediately if symptoms worsen or persist or new concerns arise.  Patient Instructions  -do exercises provided  follow up with your doctor in 4 weeks or sooner if worsening ro concerns     KIM, HANNAH R.

## 2013-05-13 ENCOUNTER — Ambulatory Visit: Payer: BC Managed Care – PPO | Admitting: Internal Medicine

## 2013-05-18 ENCOUNTER — Ambulatory Visit (INDEPENDENT_AMBULATORY_CARE_PROVIDER_SITE_OTHER): Payer: BC Managed Care – PPO | Admitting: Internal Medicine

## 2013-05-18 ENCOUNTER — Encounter: Payer: Self-pay | Admitting: Internal Medicine

## 2013-05-18 VITALS — BP 116/72 | HR 74 | Temp 97.3°F | Resp 18 | Wt 156.0 lb

## 2013-05-18 DIAGNOSIS — M5412 Radiculopathy, cervical region: Secondary | ICD-10-CM

## 2013-05-18 DIAGNOSIS — M5010 Cervical disc disorder with radiculopathy, unspecified cervical region: Secondary | ICD-10-CM

## 2013-05-18 NOTE — Patient Instructions (Signed)

## 2013-05-18 NOTE — Progress Notes (Signed)
Subjective:    Patient ID: Gabriela Bean, female    DOB: 1976/01/08, 37 y.o.   MRN: [id number]  HPI  37 year old patient who is seen today in followup. She presented one month ago with paresthesias involving the left leg in 1 week later some brief paresthesias involving her left upper outer arm. All symptoms resolved after approximately one week . There was no motor weakness. She does have a history more chronic right-sided sciatica-type symptoms   Past Medical History  Diagnosis Date  . ALLERGIC RHINITIS 01/06/2009  . Infertility, female   . VBAC, delivered, current hospitalization 09/01/2012    History   Social History  . Marital Status: Married    Spouse Name: N/A    Number of Children: N/A  . Years of Education: N/A   Occupational History  . Not on file.   Social History Main Topics  . Smoking status: Former Smoker    Quit date: 07/16/2005  . Smokeless tobacco: Not on file  . Alcohol Use: No  . Drug Use: No  . Sexual Activity: Yes   Other Topics Concern  . Not on file   Social History Narrative  . No narrative on file    Past Surgical History  Procedure Laterality Date  . Cesarean section  03/25/10  . Knee surgery Left 2010    torn NCL    Family History  Problem Relation Age of Onset  . Diabetes Mother   . Hypertension Mother   . Hypertension Father   . Cancer Paternal Grandmother   . Cancer Paternal Grandfather     Allergies  Allergen Reactions  . Pseudoephedrine   . Latex     Current Outpatient Prescriptions on File Prior to Visit  Medication Sig Dispense Refill  . acetaminophen (TYLENOL) 325 MG tablet Take 650 mg by mouth every 6 (six) hours as needed for pain.      . prenatal vitamin w/FE, FA (PRENATAL 1 + 1) 27-1 MG TABS Take 1 tablet by mouth daily.  30 each  12   No current facility-administered medications on file prior to visit.    BP 116/72  Pulse 74  Temp(Src) 97.3 F (36.3 C) (Oral)  Resp 18  Wt 156 lb (70.761 kg)  SpO2  98%     Review of Systems  Constitutional: Negative.   HENT: Negative for congestion, dental problem, hearing loss, rhinorrhea, sinus pressure, sore throat and tinnitus.   Eyes: Negative for pain, discharge and visual disturbance.  Respiratory: Negative for cough and shortness of breath.   Cardiovascular: Negative for chest pain, palpitations and leg swelling.  Gastrointestinal: Negative for nausea, vomiting, abdominal pain, diarrhea, constipation, blood in stool and abdominal distention.  Genitourinary: Negative for dysuria, urgency, frequency, hematuria, flank pain, vaginal bleeding, vaginal discharge, difficulty urinating, vaginal pain and pelvic pain.  Musculoskeletal: Negative for arthralgias, gait problem and joint swelling.  Skin: Negative for rash.  Neurological: Positive for numbness. Negative for dizziness, syncope, speech difficulty, weakness and headaches.  Hematological: Negative for adenopathy.  Psychiatric/Behavioral: Negative for behavioral problems, dysphoric mood and agitation. The patient is not nervous/anxious.        Objective:   Physical Exam  Constitutional: She is oriented to person, place, and time. She appears well-developed and well-nourished.  HENT:  Head: Normocephalic.  Left Ear: External ear normal.  Eyes: EOM are normal.  Neck: Normal range of motion. Neck supple. No thyromegaly present.  Abdominal: She exhibits no mass. There is no tenderness.  Musculoskeletal: Normal range of  motion.  Negative straight leg test No motor weakness Reflexes brisk and symmetrical  Able to walk on toes and heels without difficulty  Lymphadenopathy:    She has no cervical adenopathy.  Neurological: She is alert and oriented to person, place, and time. No cranial nerve deficit. Coordination normal.  Normal grip strength Biceps and triceps reflexes normal  Skin: Skin is warm and dry. No rash noted.  Psychiatric: She has a normal mood and affect. Her behavior is  normal.          Assessment & Plan:  History left arm and left leg paresthesias. Now resolved.  History of chronic intermittent right-sided sciatica. Stable  The patient has improved and now is back to baseline.  Probably resolved mild cervical radiculopathy

## 2013-11-10 ENCOUNTER — Telehealth: Payer: Self-pay | Admitting: Internal Medicine

## 2013-11-10 NOTE — Telephone Encounter (Signed)
Completed a [facility] referral pt to see  [facility]: Bing PlumeHenley Thomas F MD  Address: [address], [place], [place] [address]  [phone] Authorization #is [id number] faxed  The referral to [phone]

## 2013-11-13 ENCOUNTER — Other Ambulatory Visit: Payer: Self-pay | Admitting: Obstetrics and Gynecology

## 2013-11-13 DIAGNOSIS — N6310 Unspecified lump in the right breast, unspecified quadrant: Secondary | ICD-10-CM

## 2013-11-24 ENCOUNTER — Ambulatory Visit
Admission: RE | Admit: 2013-11-24 | Discharge: 2013-11-24 | Disposition: A | Discharge status: Home / Self Care | Payer: 59 | Source: Ambulatory Visit | Attending: Obstetrics and Gynecology | Admitting: Obstetrics and Gynecology

## 2013-11-24 ENCOUNTER — Other Ambulatory Visit: Payer: Self-pay | Admitting: Obstetrics and Gynecology

## 2013-11-24 DIAGNOSIS — N6310 Unspecified lump in the right breast, unspecified quadrant: Secondary | ICD-10-CM

## 2013-12-21 ENCOUNTER — Telehealth: Payer: Self-pay | Admitting: Internal Medicine

## 2013-12-21 DIAGNOSIS — N6310 Unspecified lump in the right breast, unspecified quadrant: Secondary | ICD-10-CM

## 2013-12-21 NOTE — Telephone Encounter (Signed)
Spoke to pt told her she does not need a referral with [employer] unless it is [facility]. Pt said she was told by GYN office that she needs a referral. Told pt okay will send order for referral to our referral coordinator and she will take care of it. Pt verbalized understanding. Order sent.

## 2013-12-21 NOTE — Telephone Encounter (Signed)
Pt called to req a referral to her GYN  Dr Huel Cote

## 2014-04-29 ENCOUNTER — Other Ambulatory Visit: Payer: Self-pay

## 2014-04-29 ENCOUNTER — Other Ambulatory Visit: Payer: Self-pay | Admitting: Obstetrics and Gynecology

## 2014-04-29 DIAGNOSIS — N6489 Other specified disorders of breast: Secondary | ICD-10-CM

## 2014-04-29 DIAGNOSIS — N6001 Solitary cyst of right breast: Secondary | ICD-10-CM

## 2014-05-17 ENCOUNTER — Encounter: Payer: Self-pay | Admitting: Internal Medicine

## 2014-05-26 ENCOUNTER — Ambulatory Visit
Admission: RE | Admit: 2014-05-26 | Discharge: 2014-05-26 | Disposition: A | Discharge status: Home / Self Care | Payer: 59 | Source: Ambulatory Visit | Attending: Obstetrics and Gynecology | Admitting: Obstetrics and Gynecology

## 2014-05-26 DIAGNOSIS — N6489 Other specified disorders of breast: Secondary | ICD-10-CM

## 2014-05-26 DIAGNOSIS — N6001 Solitary cyst of right breast: Secondary | ICD-10-CM

## 2014-10-25 ENCOUNTER — Other Ambulatory Visit: Payer: Self-pay | Admitting: Obstetrics and Gynecology

## 2014-10-25 DIAGNOSIS — N6310 Unspecified lump in the right breast, unspecified quadrant: Secondary | ICD-10-CM

## 2014-12-02 ENCOUNTER — Ambulatory Visit
Admission: RE | Admit: 2014-12-02 | Discharge: 2014-12-02 | Disposition: A | Discharge status: Home / Self Care | Payer: BLUE CROSS/BLUE SHIELD | Source: Ambulatory Visit | Attending: Obstetrics and Gynecology | Admitting: Obstetrics and Gynecology

## 2014-12-02 DIAGNOSIS — N6310 Unspecified lump in the right breast, unspecified quadrant: Secondary | ICD-10-CM

## 2015-07-06 ENCOUNTER — Ambulatory Visit (INDEPENDENT_AMBULATORY_CARE_PROVIDER_SITE_OTHER): Payer: 59 | Admitting: Family Medicine

## 2015-07-06 VITALS — BP 122/68 | HR 79 | Temp 98.2°F | Resp 18 | Ht 66.5 in | Wt 157.0 lb

## 2015-07-06 DIAGNOSIS — J0100 Acute maxillary sinusitis, unspecified: Secondary | ICD-10-CM

## 2015-07-06 MED ORDER — AMOXICILLIN-POT CLAVULANATE 875-125 MG PO TABS: 1.0000 | ORAL_TABLET | Freq: Two times a day (BID) | ORAL | Status: DC

## 2015-07-06 MED ORDER — METHYLPREDNISOLONE ACETATE 80 MG/ML IJ SUSP
80.0000 mg | Freq: Once | INTRAMUSCULAR | Status: AC
  Administered 2015-07-06: 80 mg via INTRAMUSCULAR

## 2015-07-06 NOTE — Progress Notes (Signed)
Subjective:  This chart was scribed for Norberto SorensonEva Juwann Sherk, MD by Stann Oresung-Kai Tsai, Medical Scribe. This patient was seen in Room 10 and the patient's care was started 11:30 AM.   Patient ID: Gabriela Bean, female    DOB: [birth year], 39 y.o.   MRN: [id number] Chief Complaint  Patient presents with  . Nasal Congestion    over 2 weeks now   . Sinusitis   HPI Gabriela Bean is a 39 y.o. female who [facility] to [facility] complaining of nasal congestion with some sinusitis that started 2 weeks ago. She thought she was improving from her illness but she noticed symptoms worsened with sinus pressure 2 days ago. She has used neti pot and nasal spray without resolve. She also notes that both of her children are sick. She has some chills. She's been coughing and taking ibuprofen at night for some relief. She denies fever. She plans to visit her father, who's going through chemo radiation, in [place]. She is a former smoker.   Past Medical History  Diagnosis Date  . ALLERGIC RHINITIS 01/06/2009  . Infertility, female   . VBAC, delivered, current hospitalization 09/01/2012  . Depression    Prior to Admission medications   Medication Sig Start Date End Date Taking? Authorizing Provider  escitalopram (LEXAPRO) 10 MG tablet Take 10 mg by mouth daily.   Yes Historical Provider, MD  acetaminophen (TYLENOL) 325 MG tablet Take 650 mg by mouth every 6 (six) hours as needed for pain. Reported on 07/06/2015    Historical Provider, MD  prenatal vitamin w/FE, FA (PRENATAL 1 + 1) 27-1 MG TABS Take 1 tablet by mouth daily. Patient not taking: Reported on 07/06/2015 09/02/12   Sherian ReinJody Bovard-Stuckert, MD   Allergies  Allergen Reactions  . Pseudoephedrine   . Latex     Review of Systems  Constitutional: Positive for chills and fatigue. Negative for fever.  HENT: Positive for congestion and sinus pressure. Negative for rhinorrhea.   Respiratory: Positive for cough. Negative for shortness of breath and wheezing.     Gastrointestinal: Negative for nausea, vomiting, diarrhea and constipation.       Objective:   Physical Exam  Constitutional: She is oriented to person, place, and time. She appears well-developed and well-nourished. No distress.  HENT:  Head: Normocephalic and atraumatic.  Right Ear: Tympanic membrane is injected.  Left Ear: Tympanic membrane is injected.  Some postnasal drip noted  Eyes: EOM are normal. Pupils are equal, round, and reactive to light.  Neck: Neck supple. No thyromegaly present.  Cardiovascular: Normal rate, regular rhythm, S1 normal, S2 normal and normal heart sounds.   No murmur heard. Pulmonary/Chest: Effort normal and breath sounds normal. No respiratory distress. She has no wheezes.  Musculoskeletal: Normal range of motion.  Lymphadenopathy:       Head (right side): Tonsillar adenopathy present.       Head (left side): Tonsillar adenopathy present.       Right: Supraclavicular adenopathy present.       Left: Supraclavicular adenopathy present.  Neurological: She is alert and oriented to person, place, and time.  Skin: Skin is warm and dry.  Psychiatric: She has a normal mood and affect. Her behavior is normal.  Nursing note and vitals reviewed.   BP 122/68 mmHg  Pulse 79  Temp(Src) 98.2 F (36.8 C) (Oral)  Resp 18  Ht 5' 6.5" (1.689 m)  Wt 157 lb (71.215 kg)  BMI 24.96 kg/m2  SpO2 97%  LMP 06/06/2015  Assessment & Plan:   1. Acute maxillary sinusitis, recurrence not specified     Meds ordered this encounter  Medications  . escitalopram (LEXAPRO) 10 MG tablet    Sig: Take 10 mg by mouth daily.  . methylPREDNISolone acetate (DEPO-MEDROL) injection 80 mg    Sig:   . amoxicillin-clavulanate (AUGMENTIN) 875-125 MG tablet    Sig: Take 1 tablet by mouth 2 (two) times daily.    Dispense:  20 tablet    Refill:  0    I personally performed the services described in this documentation, which was scribed in my presence. The recorded  information has been reviewed and considered, and addended by me as needed.  Norberto Sorenson, MD MPH    By signing my name below, I, Stann Ore, attest that this documentation has been prepared under the direction and in the presence of Norberto Sorenson, MD. Electronically Signed: Stann Ore, Scribe. 07/06/2015 , 11:30 AM .

## 2015-07-06 NOTE — Patient Instructions (Signed)

## 2016-09-13 ENCOUNTER — Telehealth: Payer: Self-pay | Admitting: Internal Medicine

## 2016-09-13 NOTE — Telephone Encounter (Signed)
Patient Name: Gabriela Bean  DOB: September 20, 1975    Initial Comment Caller needing rx for Tamiflu   Nurse Assessment  Nurse: Vickey SagesAtkins, RN, Jacquilin Date/Time (Eastern Time): 09/13/2016 12:49:04 PM  Confirm and document reason for call. If symptomatic, describe symptoms. ---Caller states she just left the peds with both her kids are on Tamiflu for + flu. Caller states she has a fever 100, congestion, and cough. Caller is requesting RX for Tamiflu.  Does the patient have any new or worsening symptoms? ---Yes  Will a triage be completed? ---Yes  Related visit to physician within the last 2 weeks? ---No  Does the PT have any chronic conditions? (i.e. diabetes, asthma, etc.) ---No  Is the patient pregnant or possibly pregnant? (Ask all females between the ages of 4012-55) ---No  Is this a behavioral health or substance abuse call? ---No    Nurse: Vickey SagesAtkins, RN, Jacquilin Date/Time (Eastern Time): 09/13/2016 12:54:37 PM  Please select the assessment type ---Verbal order / New medication order   Additional Documentation ---Mother is requesting RX for tamiflu.Child + for flu. Patient started showing symptoms yesterday   Does the client directives allow for assistance with medications after hours? ---Yes   Other current medications? ---Yes   List current medications. ---antidepressant. Unsure of name   Medication allergies? ---Yes   List medication allergies. ---Sudafed and Latex   Pharmacy name and phone number. ---[facility] [phone]   Does the client directive allow for RN to call in the medication order to the pharmacy? ---Yes   Additional Documentation ---PLEASE CALL PATIENT BACK REGARDING RX      Guidelines    Guideline Title Affirmed Question Affirmed Notes  Influenza - Seasonal [1] Patient is NOT HIGH RISK AND [2] strongly requests antiviral medicine AND [3] flu symptoms present < 48 hours    Final Disposition User   Call PCP within 24 Hours Atkins, RN, Jacquilin    Comments  Patient symptoms  started yesterday   Referrals  REFERRED TO PCP OFFICE   Disagree/Comply: Comply   PLEASE CALL PATIENT BACK TODAY IF POSSIBLE

## 2016-09-13 NOTE — Telephone Encounter (Signed)
See message below, please advise.

## 2016-09-13 NOTE — Telephone Encounter (Signed)
Pt's 2 children both diagnosed with flu. She now has symptoms. She would like Tamiflu sent in to pharmacy.   Dr. Kirtland BouchardK - Please advise. Thanks!

## 2016-09-13 NOTE — Telephone Encounter (Signed)
Pt calling to check on the status of the Rx.

## 2016-09-13 NOTE — Telephone Encounter (Signed)
Okay Tamiflu 75 mg  #10 one daily

## 2016-09-14 MED ORDER — OSELTAMIVIR PHOSPHATE 75 MG PO CAPS: 75.0000 mg | ORAL_CAPSULE | Freq: Every day | ORAL | 0 refills | Status: DC

## 2016-09-14 NOTE — Telephone Encounter (Signed)
Spoke with pt and informed her that a Rx for Tamiflu 75mg  was [facility] to [facility]. PT verbalized understanding

## 2018-03-20 DIAGNOSIS — M25562 Pain in left knee: Secondary | ICD-10-CM | POA: Insufficient documentation

## 2018-12-04 ENCOUNTER — Telehealth: Payer: Self-pay | Admitting: Genetic Counselor

## 2018-12-04 NOTE — Telephone Encounter (Signed)
Pt cld wanting to schedule a genetic counseling appt bc her father tested + for Brca. She has been scheduled to see Gabriela Bean on 7/6 at [facility] to arrive 15 minutes early.

## 2019-01-09 ENCOUNTER — Other Ambulatory Visit: Payer: Self-pay | Admitting: Genetic Counselor

## 2019-01-09 DIAGNOSIS — Z803 Family history of malignant neoplasm of breast: Secondary | ICD-10-CM

## 2019-01-12 ENCOUNTER — Inpatient Hospital Stay: Payer: 59 | Attending: Genetic Counselor | Admitting: Genetic Counselor

## 2019-01-12 ENCOUNTER — Other Ambulatory Visit: Payer: Self-pay

## 2019-01-12 ENCOUNTER — Encounter: Payer: Self-pay | Admitting: Genetic Counselor

## 2019-01-12 ENCOUNTER — Inpatient Hospital Stay: Payer: 59

## 2019-01-12 DIAGNOSIS — Z803 Family history of malignant neoplasm of breast: Secondary | ICD-10-CM

## 2019-01-12 DIAGNOSIS — Z8041 Family history of malignant neoplasm of ovary: Secondary | ICD-10-CM | POA: Diagnosis not present

## 2019-01-12 DIAGNOSIS — Z8042 Family history of malignant neoplasm of prostate: Secondary | ICD-10-CM

## 2019-01-12 DIAGNOSIS — Z8481 Family history of carrier of genetic disease: Secondary | ICD-10-CM

## 2019-01-12 NOTE — Progress Notes (Signed)
REFERRING PROVIDER: Marletta Lor, MD [place],  [place] [address]  PRIMARY PROVIDER:  Ernestene Kiel, MD  PRIMARY REASON FOR VISIT:  1. Family history of breast cancer   2. Family history of ovarian cancer   3. Family history of prostate cancer   4. Family history of BRCA2 gene positive      HISTORY OF PRESENT ILLNESS:   Ms. Minahan, a 43 y.o. female, was seen for a [place] cancer genetics consultation at the request of Dr. Burnice Logan due to a family history of breast, ovarian, and prostate cancer and a known family mutation (KFM) in [place].  Ms. Hankinson presents to clinic today to discuss the possibility of a hereditary predisposition to cancer, genetic testing, and to further clarify her future cancer risks, as well as potential cancer risks for family members.   Ms. Ziemba is a 43 y.o. female with no personal history of cancer.  Her father recently was tested for hereditary cancer syndromes and was identified with a BRCA2 mutation.  CANCER HISTORY:  Oncology History   No history exists.     RISK FACTORS:  Menarche was at age 8.  First live birth at age 42.  OCP use for approximately 13 years.  Ovaries intact: yes.  Hysterectomy: no.  Menopausal status: premenopausal.  HRT use: 0 years. Colonoscopy: no; not examined. Mammogram within the last year: yes. Number of breast biopsies: 0. Up to date with pelvic exams: yes. Any excessive radiation exposure in the past: no  Past Medical History:  Diagnosis Date  . ALLERGIC RHINITIS 01/06/2009  . Depression   . Family history of BRCA2 gene positive   . Family history of breast cancer   . Family history of ovarian cancer   . Family history of prostate cancer   . Infertility, female   . VBAC, delivered, current hospitalization 09/01/2012    Past Surgical History:  Procedure Laterality Date  . CESAREAN SECTION  03/25/10  . KNEE SURGERY Left 2010   torn NCL    Social History   Socioeconomic  History  . Marital status: Married    Spouse name: Not on file  . Number of children: Not on file  . Years of education: Not on file  . Highest education level: Not on file  Occupational History  . Not on file  Social Needs  . Financial resource strain: Not on file  . Food insecurity    Worry: Not on file    Inability: Not on file  . Transportation needs    Medical: Not on file    Non-medical: Not on file  Tobacco Use  . Smoking status: Former Smoker    Quit date: 07/16/2005    Years since quitting: 13.5  Substance and Sexual Activity  . Alcohol use: No  . Drug use: No  . Sexual activity: Yes  Lifestyle  . Physical activity    Days per week: Not on file    Minutes per session: Not on file  . Stress: Not on file  Relationships  . Social Herbalist on phone: Not on file    Gets together: Not on file    Attends religious service: Not on file    Active member of club or organization: Not on file    Attends meetings of clubs or organizations: Not on file    Relationship status: Not on file  Other Topics Concern  . Not on file  Social History Narrative  .  Not on file     FAMILY HISTORY:  We obtained a detailed, 4-generation family history.  Significant diagnoses are listed below: Family History  Problem Relation Age of Onset  . Diabetes Mother   . Hypertension Mother   . Hypertension Father   . Cancer Paternal Grandmother   . Cancer Paternal Grandfather     The patient has a son and daughter who are cancer free.  Her son has Peutz-Jaeger syndrome that was inherited from his father.  She has two sisters and three brothers.  Her oldest sister has tested negative for the [facility] in BRCA2, and her youngest sister has tested positive.  Both parents are living.  The patient's mother is cancer free. She has one brother who is estranged from the family.  The maternal grandparents are deceased.  The grandmother died from complications of alcohol abuse, and the  grandfather died of congestive heart failure.  He had colon cancer at some point.  The patient's father was diagnosed with prostate cancer at 20.  He recently underwent genetic testing and was found to have a BRCA2 mutation.  He has two siblings, a brother and sister.  His sister does not have the KFM in [place], and his brother has the mutation.  His brother has a daughter who was diagnosed with breast cancer in her early 31's and was originally found to carry the mutation.    The paternal grandparents are deceased.  The grandfather had prostate cancer, as did his father.  The grandmother had both breast and ovarian cancer.  Ms. Rommel is aware of previous family history of genetic testing for hereditary cancer risks. Patient's maternal ancestors are of [place] and [place] descent, and paternal ancestors are of [place] and [place] descent. There is no reported Ashkenazi Jewish ancestry. There is no known consanguinity.  GENETIC COUNSELING ASSESSMENT: Ms. Titsworth is a 43 y.o. female with a family history of breast, ovarian and prostate cancer and a KFM in BRCA2 which is somewhat suggestive of a hereditary cancer syndrome and predisposition to cancer. We, therefore, discussed and recommended the following at today's visit.   DISCUSSION: We discussed that 5 - 10% of breast cancer is hereditary, with most cases associated with BRCA mutations.  Her paternal cousin was most likely tested due to her personal history of breast cancer and the family history of breast, prostate and ovarian cancer.  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  Recently, the patient's father underwent genetic testing and was also identified as a carrier of the known family mutation [facility]).  Therefore, she has a 50% chance of being a carrier for this mutation.  We discussed that if she is identified with a BRCA2 mutation, we would change medical management, and then offer genetic testing to her  children when they become adults.     We reviewed the characteristics, features and inheritance patterns of hereditary cancer syndromes. We also discussed genetic testing, including the appropriate family members to test, the process of testing, insurance coverage and turn-around-time for results. We discussed the implications of a negative, positive and/or variant of uncertain significant result. We recommended Ms. Mcdonagh pursue genetic testing for the known family mutation in the BRCA2 gene.   PLAN: After considering the risks, benefits, and limitations, Ms. Aubert provided informed consent to pursue genetic testing and the blood sample was sent to [facility] for analysis of the BRCA2 KFM. Results should be available within approximately 2-3 weeks'  time, at which point they will be disclosed by telephone to Ms. Rothbauer, as will any additional recommendations warranted by these results. Ms. Volland will receive a summary of her genetic counseling visit and a copy of her results once available. This information will also be available in Epic.   Lastly, we encouraged Ms. Gaylord to remain in contact with cancer genetics annually so that we can continuously update the family history and inform her of any changes in cancer genetics and testing that may be of benefit for this family.   Ms. Tipps questions were answered to her satisfaction today. Our contact information was provided should additional questions or concerns arise. Thank you for the referral and allowing [place] to share in the care of your patient.    P. Florene Glen, [place], [facility] Certified Genetic Counselor [place] [email or web address] phone: [phone]  The patient was seen for a total of 30 minutes in face-to-face genetic counseling.  This patient was discussed with Drs. Magrinat, Lindi Adie and/or Burr Medico who agrees with the above.    _______________________________________________________________________ For Office Staff:  Number of people involved  in session: 1 Was an Intern/ student involved with case: no

## 2019-01-19 ENCOUNTER — Other Ambulatory Visit: Payer: BLUE CROSS/BLUE SHIELD

## 2019-01-19 ENCOUNTER — Encounter: Payer: BLUE CROSS/BLUE SHIELD | Admitting: Genetic Counselor

## 2019-01-27 ENCOUNTER — Encounter: Payer: Self-pay | Admitting: Genetic Counselor

## 2019-01-27 DIAGNOSIS — Z1379 Encounter for other screening for genetic and chromosomal anomalies: Secondary | ICD-10-CM | POA: Insufficient documentation

## 2019-01-28 ENCOUNTER — Telehealth: Payer: Self-pay | Admitting: Genetic Counselor

## 2019-01-28 ENCOUNTER — Ambulatory Visit: Payer: Self-pay | Admitting: Genetic Counselor

## 2019-01-28 DIAGNOSIS — Z1379 Encounter for other screening for genetic and chromosomal anomalies: Secondary | ICD-10-CM

## 2019-01-28 NOTE — Progress Notes (Signed)
HPI:  Ms. Steines was previously seen in the [place] clinic due to a family history of cancer, and a known familial BRCA2 mutation, as well as concerns regarding a hereditary predisposition to cancer. Please refer to our prior cancer genetics clinic note for more information regarding our discussion, assessment and recommendations, at the time. Ms. Bluett recent genetic test results were disclosed to her, as were recommendations warranted by these results. These results and recommendations are discussed in more detail below.  CANCER HISTORY:  Oncology History   No history exists.    FAMILY HISTORY:  We obtained a detailed, 4-generation family history.  Significant diagnoses are listed below: Family History  Problem Relation Age of Onset   Diabetes Mother    Hypertension Mother    Hypertension Father    Cancer Paternal Grandmother    Cancer Paternal Grandfather     The patient has a son and daughter who are cancer free.  Her son has Peutz-Jaeger syndrome that was inherited from his father.  She has two sisters and three brothers.  Her oldest sister has tested negative for the [facility] in BRCA2, and her youngest sister has tested positive. Both parents are living.  The patient's mother is cancer free. She has one brother who is estranged from the family. The maternal grandparents are deceased. The grandmother died from complications of alcohol abuse, and the grandfather died of congestive heart failure. He had colon cancer at some point.  The patient's father was diagnosed with prostate cancer at 66. He recently underwent genetic testing and was found to have a BRCA2 mutation. He has two siblings, a brother and sister. His sister does not have the [facility] in [place], and his brother has the mutation. His brother has a daughter who was diagnosed with breast cancer in her early 37's and was originally found to carry the mutation. The paternal grandparents are deceased. The  grandfather had prostate cancer, as did his father. The grandmother had both breast and ovarian cancer.  Ms. Hinks is aware of previous family history of genetic testing for hereditary cancer risks. Patient's maternal ancestors are of [place] and [place] descent, and paternal ancestors are of [place] and [place] descent. There is no reported Ashkenazi Jewish ancestry. There is no known consanguinity.    GENETIC TEST RESULTS:  We recommended Ms. Raymundo pursue testing for the familial hereditary cancer gene mutation called BRCA2, [id number] ([id number]). Ms. Searles test was normal and did not reveal the familial mutation. We call this result a true negative result because the cancer-causing mutation was identified in Ms. Mcarthy's family, and she did not inherit it.  Given this negative result, Ms. Hillesheim chances of developing BRCA2-related cancers are the same as they are in the general population.      ADDITIONAL GENETIC TESTING: We discussed with Ms. Mennen that there are other genes that are associated with increased cancer risk that can be analyzed. Should Ms. Dokken wish to pursue additional genetic testing, we are happy to discuss and coordinate this testing, at any time.    CANCER SCREENING RECOMMENDATIONS: Ms. Rutan test result is considered negative (normal).  This means that we have not identified a hereditary cause for her personal and family history of cancer at this time. Most cancers happen by chance and this negative test suggests that her cancer may fall into this category.    While reassuring, this does not definitively rule out a hereditary predisposition to cancer. It is still possible that there could  be genetic mutations that are undetectable by current technology. There could be genetic mutations in genes that have not been tested or identified to increase cancer risk.  Therefore, it is recommended she continue to follow the cancer management and screening guidelines provided by her  primary healthcare provider.   An individual's cancer risk and medical management are not determined by genetic test results alone. Overall cancer risk assessment incorporates additional factors, including personal medical history, family history, and any available genetic information that may result in a personalized plan for cancer prevention and surveillance  RECOMMENDATIONS FOR FAMILY MEMBERS:  Individuals in this family might be at some increased risk of developing cancer, over the general population risk, simply due to the family history of cancer.  We recommended women in this family have a yearly mammogram beginning at age 19, or 81 years younger than the earliest onset of cancer, an annual clinical breast exam, and perform monthly breast self-exams. Women in this family should also have a gynecological exam as recommended by their primary provider. All family members should have a colonoscopy by age 27.  FOLLOW-UP: Lastly, we discussed with Ms. Greeson that cancer genetics is a rapidly advancing field and it is possible that new genetic tests will be appropriate for her and/or her family members in the future. We encouraged her to remain in contact with cancer genetics on an annual basis so we can update her personal and family histories and let her know of advances in cancer genetics that may benefit this family.   Our contact number was provided. Ms. Hollick questions were answered to her satisfaction, and she knows she is welcome to call us at anytime with additional questions or concerns.   Roma Kayser, MS, [facility] Certified Genetic Counselor [email or web address]

## 2019-01-28 NOTE — Telephone Encounter (Signed)
Revealed negative genetic testing on the single site testing for the known famlial BRCA2 mutation.

## 2019-08-05 DIAGNOSIS — M180 Bilateral primary osteoarthritis of first carpometacarpal joints: Secondary | ICD-10-CM | POA: Insufficient documentation

## 2020-05-12 LAB — HM PAP SMEAR: HM Pap smear: NORMAL

## 2021-02-15 ENCOUNTER — Other Ambulatory Visit: Payer: Self-pay

## 2021-02-15 ENCOUNTER — Encounter: Payer: Self-pay | Admitting: Sports Medicine

## 2021-02-15 ENCOUNTER — Ambulatory Visit: Payer: 59 | Admitting: Sports Medicine

## 2021-02-15 ENCOUNTER — Ambulatory Visit (INDEPENDENT_AMBULATORY_CARE_PROVIDER_SITE_OTHER): Payer: 59

## 2021-02-15 DIAGNOSIS — M216X1 Other acquired deformities of right foot: Secondary | ICD-10-CM

## 2021-02-15 DIAGNOSIS — M722 Plantar fascial fibromatosis: Secondary | ICD-10-CM

## 2021-02-15 DIAGNOSIS — M216X2 Other acquired deformities of left foot: Secondary | ICD-10-CM | POA: Diagnosis not present

## 2021-02-15 DIAGNOSIS — M79672 Pain in left foot: Secondary | ICD-10-CM

## 2021-02-15 MED ORDER — TRIAMCINOLONE ACETONIDE 10 MG/ML IJ SUSP
10.0000 mg | Freq: Once | INTRAMUSCULAR | Status: AC
  Administered 2021-02-15: 10 mg

## 2021-02-15 NOTE — Patient Instructions (Signed)

## 2021-02-15 NOTE — Progress Notes (Signed)
Subjective: Gabriela Bean is a 45 y.o. female patient presents to office with complaint of moderate heel pain on the left heels for the last month states that she was diagnosed with Planter fasciitis many years ago and was prescribed a brace and night splint the night splint has helped the most states that pain is still present worse with walking and states that she has been trying to stretch to also help states that this pain started likely after she went running and usually running helps to decrease the tension on her tendons but this time the tendon started to tighten up and she developed pain in her heel.  Patient denies any known obvious injury.  Denies any other pedal complaints.   Patient has her children with her this visit.  Patient Active Problem List   Diagnosis Date Noted   Primary osteoarthritis of both first carpometacarpal joints 08/05/2019   Genetic testing 01/27/2019   Family history of breast cancer    Family history of ovarian cancer    Family history of prostate cancer    Family history of BRCA2 gene positive    Pain in left knee 03/20/2018   ROM (rupture of membranes), premature 09/01/2012   Acute sinus infection 08/30/2012   Dyspnea and respiratory abnormality 08/30/2012   ALLERGIC RHINITIS 01/06/2009    Current Outpatient Medications on File Prior to Visit  Medication Sig Dispense Refill   escitalopram (LEXAPRO) 10 MG tablet Take 10 mg by mouth daily.     No current facility-administered medications on file prior to visit.    Allergies  Allergen Reactions   Pseudoephedrine Other (See Comments)   Latex     Objective: Physical Exam General: The patient is alert and oriented x3 in no acute distress.  Dermatology: Skin is warm, dry and supple bilateral lower extremities. Nails 1-10 are normal. There is no erythema, edema, no eccymosis, no open lesions present. Integument is otherwise unremarkable.  Vascular: Dorsalis Pedis pulse and Posterior Tibial pulse are  2/4 bilateral. Capillary fill time is immediate to all digits.  Neurological: Grossly intact to light touch bilateral.  Musculoskeletal: Tenderness to palpation at the medial calcaneal tubercale and through the insertion of the plantar fascia on the left. No pain with compression of calcaneus bilateral. No pain with tuning fork to calcaneus bilateral. No pain with calf compression bilateral. There is decreased Ankle joint range of motion bilateral. All other joints range of motion within normal limits bilateral. Strength 5/5 in all groups bilateral.   Gait: Unassisted, Antalgic avoid weight on left  Xray, left foot Normal osseous mineralization. Joint spaces preserved. No fracture/dislocation/boney destruction. Calcaneal spur present with mild thickening of plantar fascia. No other soft tissue abnormalities or radiopaque foreign bodies.   Assessment and Plan: Problem List Items Addressed This Visit   None Visit Diagnoses     Plantar fasciitis of left foot    -  Primary   Relevant Orders   DG Foot Complete Left   Acquired equinus deformity of both feet       Pain of left heel           -Complete examination performed.  -Xrays reviewed -Discussed with patient in detail the condition of plantar fasciitis, how this occurs and general treatment options. Explained both conservative and surgical treatments.  -After oral consent and aseptic prep, injected a mixture containing 1 ml of 2%  plain lidocaine, 1 ml 0.5% plain marcaine, 0.5 ml of kenalog 10 and 0.5 ml of dexamethasone phosphate into  left heel.  Post-injection care discussed with patient.  -Recommended good supportive shoes and advised use of OTC insert or heel lifts as dispensed this visit.  Gave patient orthotic estimate form for her to check with her insurance about getting the custom orthotics from [place] versus the good feet store -Continue with night splint which she already owns -Explained and dispensed to patient daily stretching  exercises. -Recommend patient to ice affected area 1-2x daily. -Patient to return to office if symptoms fail to continue to improve after 2 to 3 weeks or sooner problems arise. Landis Martins, DPM

## 2021-02-22 ENCOUNTER — Other Ambulatory Visit: Payer: Self-pay | Admitting: Sports Medicine

## 2021-02-22 DIAGNOSIS — M722 Plantar fascial fibromatosis: Secondary | ICD-10-CM

## 2021-02-22 DIAGNOSIS — M216X2 Other acquired deformities of left foot: Secondary | ICD-10-CM

## 2021-02-22 DIAGNOSIS — M216X1 Other acquired deformities of right foot: Secondary | ICD-10-CM

## 2021-05-18 ENCOUNTER — Other Ambulatory Visit: Payer: Self-pay | Admitting: Obstetrics and Gynecology

## 2021-05-18 DIAGNOSIS — R928 Other abnormal and inconclusive findings on diagnostic imaging of breast: Secondary | ICD-10-CM

## 2021-06-13 ENCOUNTER — Ambulatory Visit: Payer: 59

## 2021-06-13 ENCOUNTER — Ambulatory Visit
Admission: RE | Admit: 2021-06-13 | Discharge: 2021-06-13 | Disposition: A | Discharge status: Home / Self Care | Payer: 59 | Source: Ambulatory Visit | Attending: Obstetrics and Gynecology | Admitting: Obstetrics and Gynecology

## 2021-06-13 ENCOUNTER — Other Ambulatory Visit: Payer: Self-pay

## 2021-06-13 DIAGNOSIS — R928 Other abnormal and inconclusive findings on diagnostic imaging of breast: Secondary | ICD-10-CM

## 2021-07-05 ENCOUNTER — Other Ambulatory Visit: Payer: Self-pay

## 2021-07-05 ENCOUNTER — Ambulatory Visit: Payer: 59 | Admitting: Sports Medicine

## 2021-07-05 DIAGNOSIS — M722 Plantar fascial fibromatosis: Secondary | ICD-10-CM

## 2021-07-05 DIAGNOSIS — M216X1 Other acquired deformities of right foot: Secondary | ICD-10-CM | POA: Diagnosis not present

## 2021-07-05 DIAGNOSIS — M79672 Pain in left foot: Secondary | ICD-10-CM

## 2021-07-05 DIAGNOSIS — M216X2 Other acquired deformities of left foot: Secondary | ICD-10-CM

## 2021-07-05 MED ORDER — TRIAMCINOLONE ACETONIDE 10 MG/ML IJ SUSP
10.0000 mg | Freq: Once | INTRAMUSCULAR | Status: AC
  Administered 2021-07-05: 20:00:00 10 mg

## 2021-07-05 NOTE — Progress Notes (Signed)
Subjective: Gabriela Bean is a 45 y.o. female returns to office for follow up evaluation after left heel injection 4 months ago.  Patient reports that the pain was doing better but has flared back up states that she got orthotics from the good [employer] store but they make her foot more painful and do not help.  Patient denies any other pedal complaints at this time.  Patient Active Problem List   Diagnosis Date Noted   Primary osteoarthritis of both first carpometacarpal joints 08/05/2019   Genetic testing 01/27/2019   Family history of breast cancer    Family history of ovarian cancer    Family history of prostate cancer    Family history of BRCA2 gene positive    Pain in left knee 03/20/2018   ROM (rupture of membranes), premature 09/01/2012   Acute sinus infection 08/30/2012   Dyspnea and respiratory abnormality 08/30/2012   ALLERGIC RHINITIS 01/06/2009    Current Outpatient Medications on File Prior to Visit  Medication Sig Dispense Refill   escitalopram (LEXAPRO) 10 MG tablet Take 10 mg by mouth daily.     No current facility-administered medications on file prior to visit.    Allergies  Allergen Reactions   Pseudoephedrine Other (See Comments)   Latex     Objective:   General:  Alert and oriented x 3, in no acute distress  Dermatology: Skin is warm, dry, and supple bilateral. Nails are within normal limits. There is no lower extremity erythema, no eccymosis, no open lesions present bilateral.   Vascular: Dorsalis Pedis and Posterior Tibial pedal pulses are intact.  Neurological: Gross sensation further via light touch.  Musculoskeletal: There is tenderness to palpation to the medial calcaneal tubercle through the insertion of the plantar fascia of the left foot.  No pain with compression to calcaneus or application of tuning fork. There is decreased Ankle joint range of motion bilateral. All other jointsrange of motion  within normal limits bilateral. Strength 5/5  bilateral.   Assessment and Plan: Problem List Items Addressed This Visit   None Visit Diagnoses     Plantar fasciitis of left foot    -  Primary   Relevant Medications   triamcinolone acetonide (KENALOG) 10 MG/ML injection 10 mg (Completed) (Start on 07/05/2021  8:15 PM)   Acquired equinus deformity of both feet       Pain of left heel             -Complete examination performed.  -Previous x-rays reviewed. -Discussed with patient in detail the condition of plantar fasciitis, how this  occurs related to the foot type of the patient and general treatment options. - Patient opted for another injection today; After oral consent and aseptic prep, injected a mixture containing 1 ml of 1%plain lidocaine, 1 ml 0.5% plain marcaine, 0.5 ml of kenalog 10 and 0.5 ml of dexmethasone phosphate to left heel at area of most pain/trigger point injection.  This is injection #2 to the area. -Dispensed cam boot for patient to wear for the next 2 weeks and after pain is better may slowly wean from using the boot -Advised patient to continue with night splint -Continue with stretching, icing daily and advised patient that once she transitions out of the boot and return to her normal shoe to take her inserts back to the good feet store to see if they can make some adjustments -Discussed long term care and reocurrence; will closely monitor; if fails to improve will consider other treatment modalities.  -  Patient to return to office in 1 month for follow up or sooner if problems or questions arise.  Landis Martins, DPM

## 2021-11-30 ENCOUNTER — Ambulatory Visit: Payer: 59 | Admitting: Sports Medicine

## 2021-12-04 ENCOUNTER — Ambulatory Visit: Payer: 59 | Admitting: Podiatry

## 2021-12-13 ENCOUNTER — Ambulatory Visit (INDEPENDENT_AMBULATORY_CARE_PROVIDER_SITE_OTHER): Payer: 59

## 2021-12-13 ENCOUNTER — Encounter: Payer: Self-pay | Admitting: Sports Medicine

## 2021-12-13 ENCOUNTER — Ambulatory Visit: Payer: 59 | Admitting: Sports Medicine

## 2021-12-13 ENCOUNTER — Other Ambulatory Visit: Payer: Self-pay | Admitting: *Deleted

## 2021-12-13 DIAGNOSIS — M722 Plantar fascial fibromatosis: Secondary | ICD-10-CM | POA: Diagnosis not present

## 2021-12-13 DIAGNOSIS — M79672 Pain in left foot: Secondary | ICD-10-CM

## 2021-12-13 DIAGNOSIS — M216X1 Other acquired deformities of right foot: Secondary | ICD-10-CM

## 2021-12-13 DIAGNOSIS — M216X2 Other acquired deformities of left foot: Secondary | ICD-10-CM

## 2021-12-13 MED ORDER — TRIAMCINOLONE ACETONIDE 10 MG/ML IJ SUSP
10.0000 mg | Freq: Once | INTRAMUSCULAR | Status: AC
  Administered 2021-12-13: 10 mg

## 2021-12-13 NOTE — Progress Notes (Signed)
Subjective: Elvin Banker is a 46 y.o. female returns to office for follow up evaluation after left heel pain. Patient reports that the pain flared up 1 month ago.  Patient reports that she tried to wear her boot but it still bothers her especially in the mornings with first few steps.  Patient denies any new changes with medication or health history since last encounter.  Reports that she is doing a lot more on her feet and is going back to school.  Patient Active Problem List   Diagnosis Date Noted   Primary osteoarthritis of both first carpometacarpal joints 08/05/2019   Genetic testing 01/27/2019   Family history of breast cancer    Family history of ovarian cancer    Family history of prostate cancer    Family history of BRCA2 gene positive    Pain in left knee 03/20/2018   ROM (rupture of membranes), premature 09/01/2012   Acute sinus infection 08/30/2012   Dyspnea and respiratory abnormality 08/30/2012   ALLERGIC RHINITIS 01/06/2009    Current Outpatient Medications on File Prior to Visit  Medication Sig Dispense Refill   escitalopram (LEXAPRO) 10 MG tablet Take 10 mg by mouth daily.     No current facility-administered medications on file prior to visit.    Allergies  Allergen Reactions   Pseudoephedrine Other (See Comments)   Latex     Objective:   General:  Alert and oriented x 3, in no acute distress  Dermatology: Skin is warm, dry, and supple bilateral. Nails are within normal limits. There is no lower extremity erythema, no eccymosis, no open lesions present bilateral.   Vascular: Dorsalis Pedis and Posterior Tibial pedal pulses are intact.  Neurological: Gross sensation further via light touch.  Musculoskeletal: There is tenderness to palpation to the medial calcaneal tubercle through the insertion of the plantar fascia of the left foot.  No pain with compression to calcaneus or application of tuning fork. There is decreased Ankle joint range of motion  bilateral. All other jointsrange of motion  within normal limits bilateral. Strength 5/5 bilateral.   Assessment and Plan: Problem List Items Addressed This Visit   None Visit Diagnoses     Plantar fasciitis of left foot    -  Primary   Relevant Orders   DG Foot Complete Left   Acquired equinus deformity of both feet       Pain of left heel             -Complete examination performed.  -Xrays reviewed inferior heel spur noted -Re-Discussed with patient in detail the condition of plantar fasciitis, how this  occurs related to the foot type of the patient and general treatment options. - Patient opted for another injection today; After oral consent and aseptic prep, injected a mixture containing 1 ml of 1%plain lidocaine, 1 ml 0.5% plain marcaine, 0.5 ml of kenalog 10 and 0.5 ml of dexmethasone phosphate to left heel at area of most pain/trigger point injection.  This is injection #1 to the area for the year -Advised patient to continue with night splint as directed and may alternate between left and right as needed -Continue with stretching, icing daily and good supportive shoes daily for foot type -Discussed long term care and reocurrence; will closely monitor; if fails to improve will consider other treatment modalities.  -Patient to return to office PRN or sooner if problems or questions arise.  Landis Martins, DPM

## 2022-07-11 IMAGING — MG MM DIGITAL DIAGNOSTIC UNILAT*R* W/ TOMO W/ CAD
4 series · 4 of 12 positions shown · non-contrast
Comparison: Previous exam(s).

CLINICAL DATA: The patient was called back for a right breast
asymmetry.

EXAM:
DIGITAL DIAGNOSTIC UNILATERAL RIGHT MAMMOGRAM WITH TOMOSYNTHESIS AND
CAD
TECHNIQUE: Right digital diagnostic mammography and breast tomosynthesis was
performed. The images were evaluated with computer-aided detection.

[R ML synth-2D]
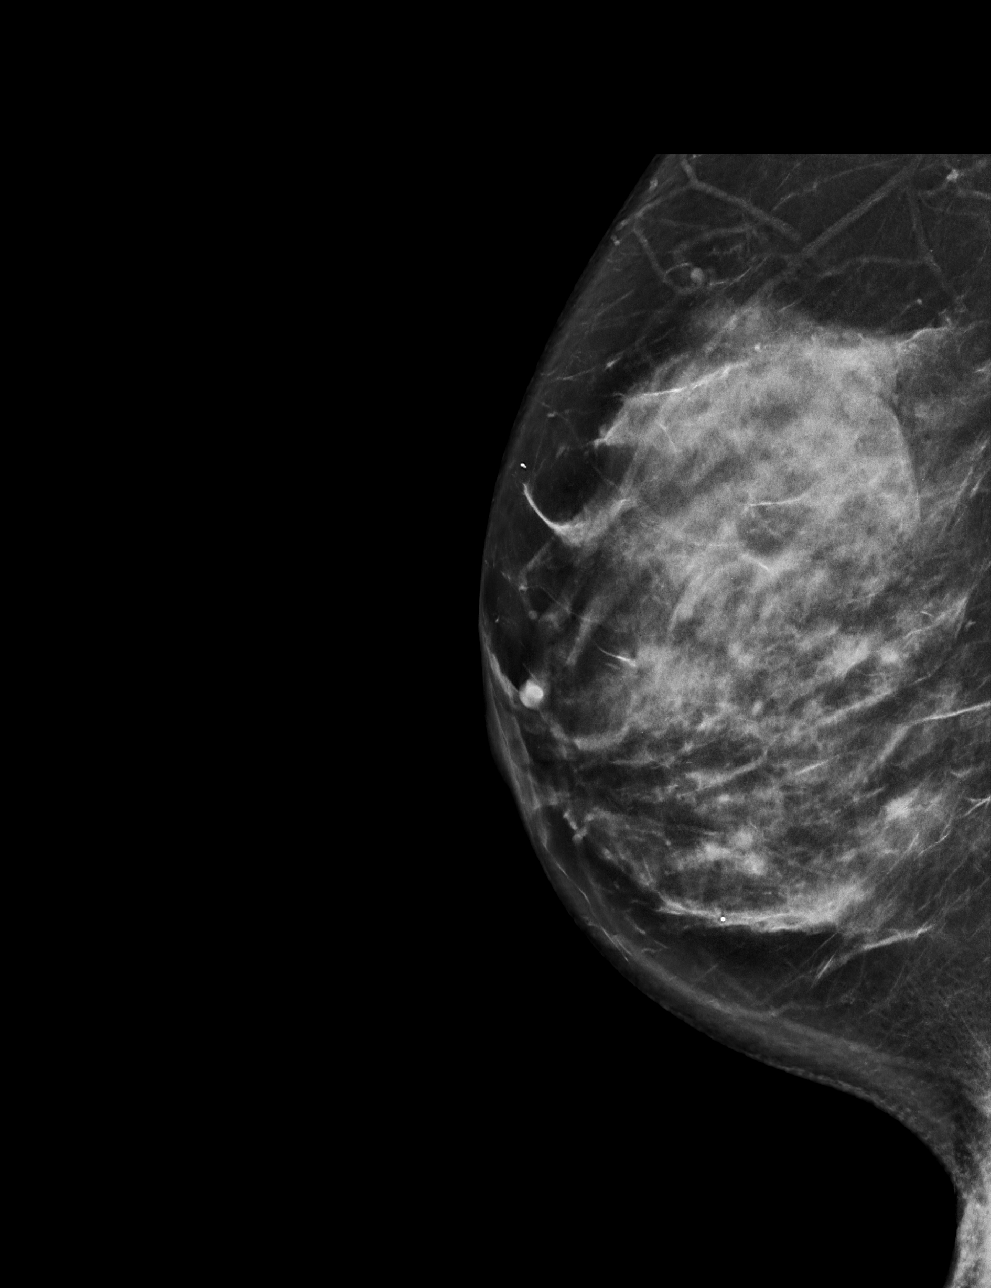

[R CC synth-2D]
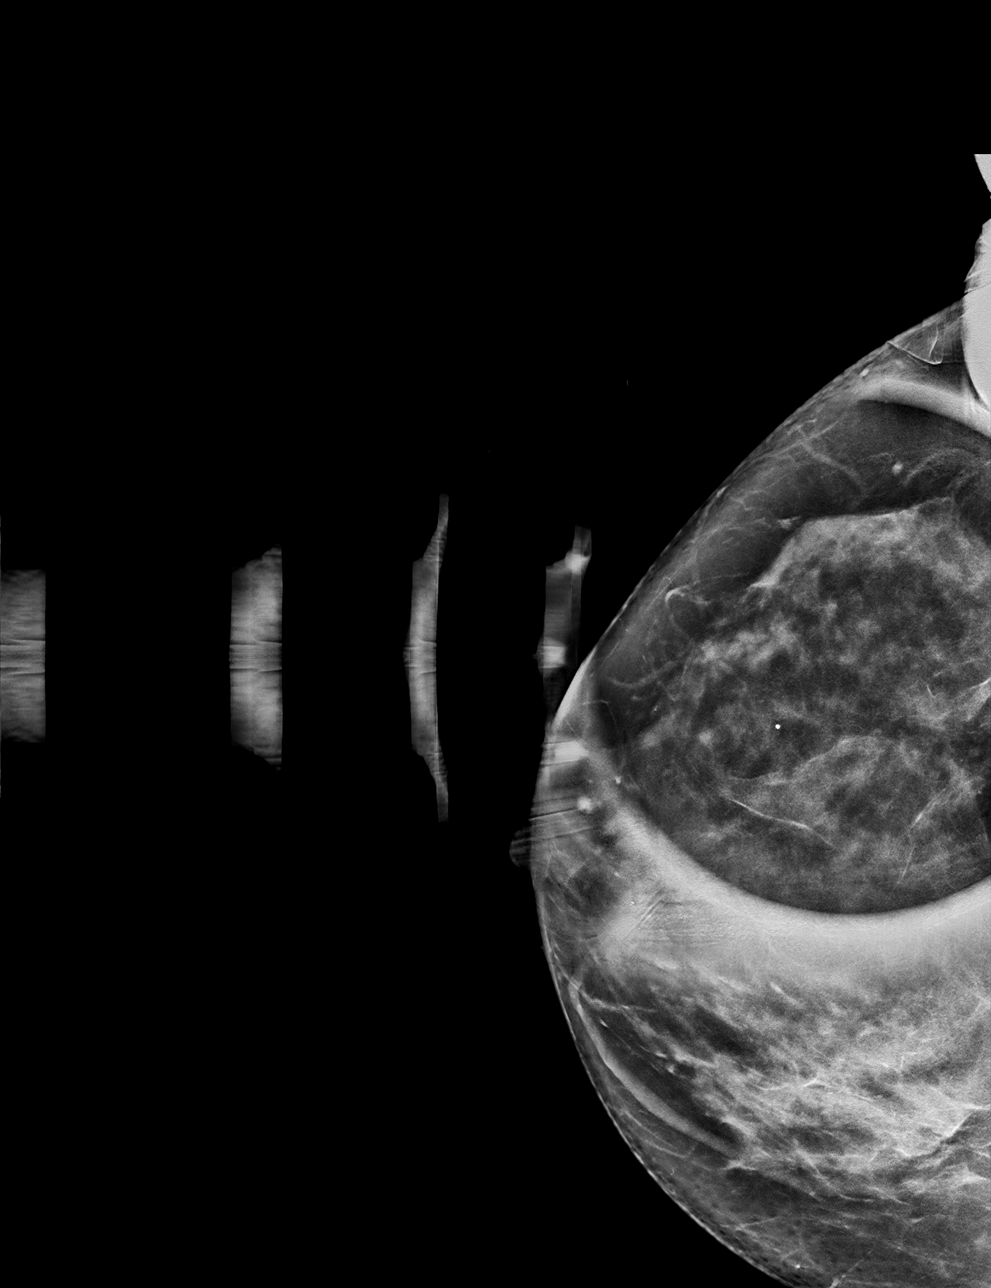

[R CC tomo · tomo slice 37/73.0]
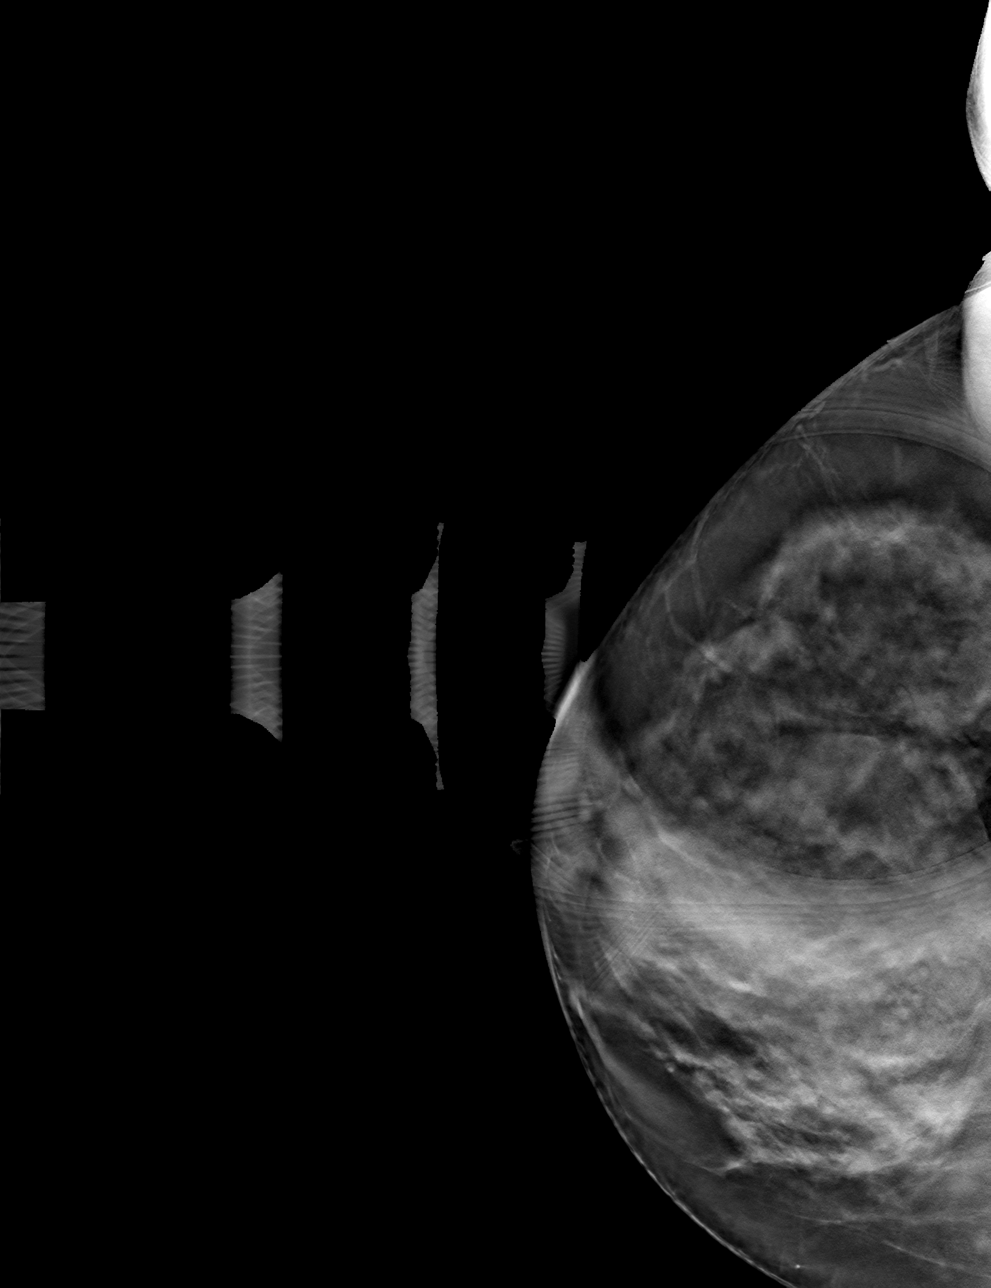

[R ML tomo · tomo slice 41/81.0]
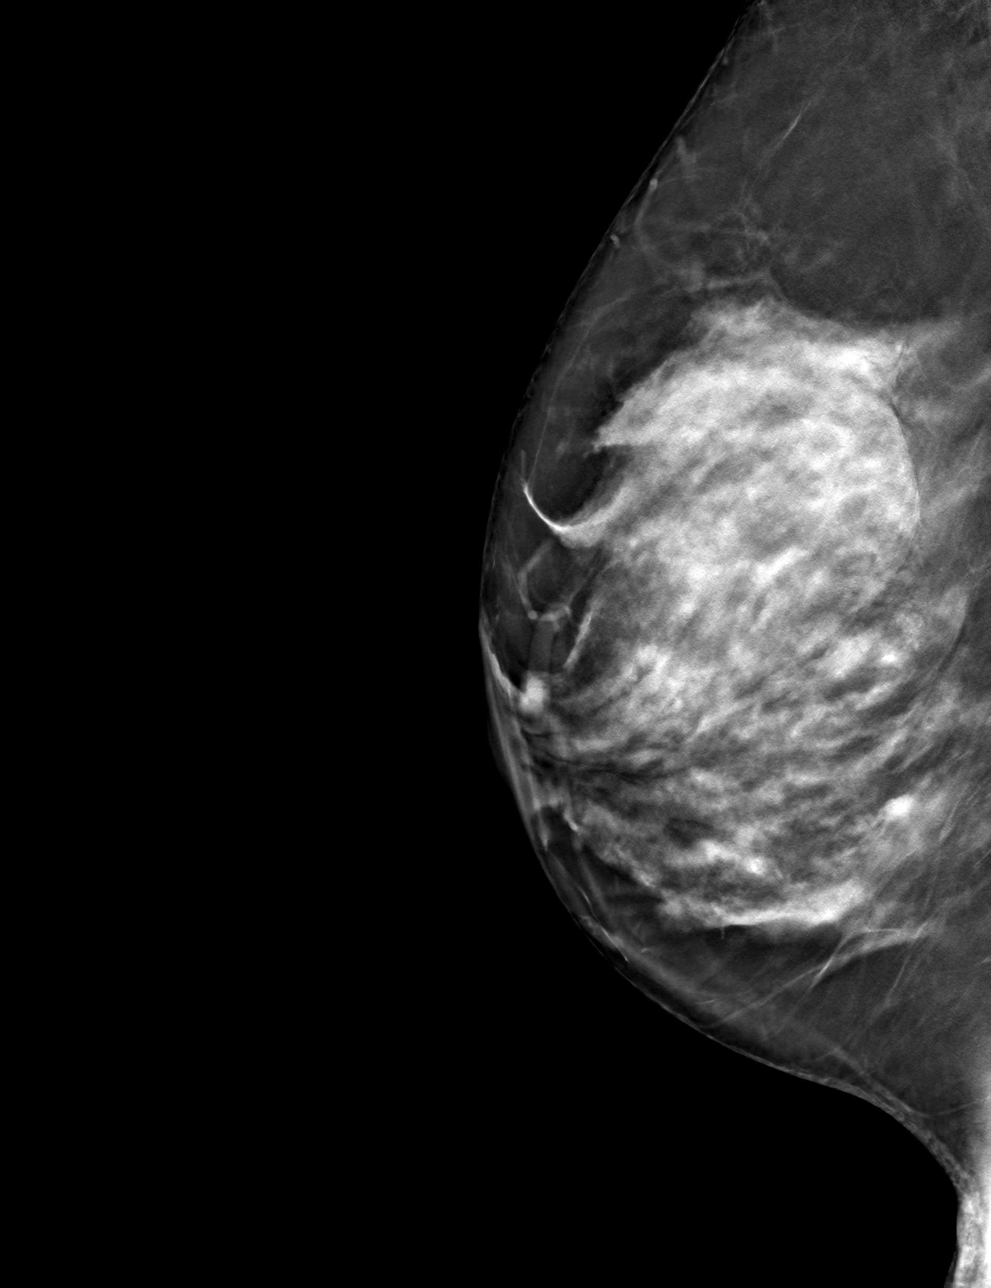

[4 of 12 positions shown; findings below may reference images not displayed]

ACR Breast Density Category c: The breast tissue is heterogeneously
dense, which may obscure small masses.
FINDINGS: The right breast asymmetry resolves on today's imaging. No evidence
of malignancy.
IMPRESSION: Resolution of the right breast asymmetry. No evidence of malignancy.

RECOMMENDATION:
Annual screening mammography.

I have discussed the findings and recommendations with the patient.
If applicable, a reminder letter will be sent to the patient
regarding the next appointment.

BI-RADS CATEGORY  1: Negative.

## 2023-04-11 ENCOUNTER — Encounter: Payer: Self-pay | Admitting: Internal Medicine

## 2023-04-11 ENCOUNTER — Ambulatory Visit: Payer: 59 | Admitting: Internal Medicine

## 2023-04-11 VITALS — BP 116/70 | HR 77 | Temp 98.0°F | Ht 66.5 in | Wt 196.2 lb

## 2023-04-11 DIAGNOSIS — Z23 Encounter for immunization: Secondary | ICD-10-CM

## 2023-04-11 DIAGNOSIS — Z0000 Encounter for general adult medical examination without abnormal findings: Secondary | ICD-10-CM | POA: Diagnosis not present

## 2023-04-11 DIAGNOSIS — F419 Anxiety disorder, unspecified: Secondary | ICD-10-CM | POA: Insufficient documentation

## 2023-04-11 NOTE — Progress Notes (Signed)
Subjective:   Gabriela Bean 09-Aug-1975  04/11/2023   CC: Chief Complaint  Patient presents with   Establish Care    HPI: Gabriela Bean is a 47 y.o. female who presents to establish care at [facility] primary care at [place] for [facility] and for a routine health maintenance exam.  Patient is not fasting, she will return for fasting lab work.  She needs physical form filled out for work insurance.    HEALTH SCREENINGS: - Pap smear: up to date -performed with OB/GYN - Mammogram (40+): Up to date -performed with OB/GYN - Colonoscopy (45+): Up to date  - Bone Density (65+): Not applicable  - Lung CA screening with low-dose CT:  Not applicable Adults age 35-80 who are current cigarette smokers or quit within the last 15 years. Must have 20 pack year history.   Depression and Anxiety Screen done today and results listed below:     04/11/2023    8:36 AM 07/06/2015   11:01 AM  Depression screen PHQ 2/9  Decreased Interest 1 0  Down, Depressed, Hopeless 1 0  PHQ - 2 Score 2 0  Altered sleeping 1   Tired, decreased energy 1   Change in appetite 0   Feeling bad or failure about yourself  0   Trouble concentrating 1   Moving slowly or fidgety/restless 0   Suicidal thoughts 0   PHQ-9 Score 5   Difficult doing work/chores Not difficult at all       04/11/2023    8:37 AM  GAD 7 : Generalized Anxiety Score  Nervous, Anxious, on Edge 1  Control/stop worrying 1  Worry too much - different things 1  Trouble relaxing 1  Restless 1  Easily annoyed or irritable 2  Afraid - awful might happen 0  Total GAD 7 Score 7  Anxiety Difficulty Somewhat difficult    IMMUNIZATIONS: - Tdap: Tetanus vaccination status reviewed: last tetanus booster within 10 years. - Influenza: Administered today - Pneumovax: Not applicable - Prevnar 20: Not applicable - Zostavax (50+): Not applicable   Past medical history, surgical history, medications, allergies, family history and social history  reviewed with patient today and changes made to appropriate areas of the chart.   Past Medical History:  Diagnosis Date   ALLERGIC RHINITIS 01/06/2009   Depression    Family history of BRCA2 gene positive    Family history of breast cancer    Family history of ovarian cancer    Family history of prostate cancer    Infertility, female    VBAC, delivered, current hospitalization 09/01/2012    Past Surgical History:  Procedure Laterality Date   CESAREAN SECTION  03/25/10   KNEE SURGERY Left 2010   torn NCL    Current Outpatient Medications on File Prior to Visit  Medication Sig   levonorgestrel (MIRENA, 52 MG,) 20 MCG/DAY IUD Take 1 device every day by intrauterine route.   loratadine (CLARITIN) 10 MG tablet Take by mouth.   escitalopram (LEXAPRO) 20 MG tablet Take 1 tablet by mouth daily.   No current facility-administered medications on file prior to visit.    Allergies  Allergen Reactions   Pseudoephedrine Other (See Comments)   Latex      Social History   Socioeconomic History   Marital status: Married    Spouse name: Not on file   Number of children: Not on file   Years of education: Not on file   Highest education level: Not on file  Occupational History  Not on file  Tobacco Use   Smoking status: Former    Current packs/day: 0.00    Types: Cigarettes    Quit date: 07/16/2005    Years since quitting: 17.7   Smokeless tobacco: Not on file  Substance and Sexual Activity   Alcohol use: No   Drug use: No   Sexual activity: Yes  Other Topics Concern   Not on file  Social History Narrative   Not on file   Social Determinants of Health   Financial Resource Strain: Not on file  Food Insecurity: Not on file  Transportation Needs: Not on file  Physical Activity: Not on file  Stress: Not on file  Social Connections: Not on file  Intimate Partner Violence: Not on file   Social History   Tobacco Use  Smoking Status Former   Current packs/day: 0.00    Types: Cigarettes   Quit date: 07/16/2005   Years since quitting: 17.7  Smokeless Tobacco Not on file   Social History   Substance and Sexual Activity  Alcohol Use No    Family History  Problem Relation Age of Onset   Diabetes Mother    Hypertension Mother    Hypertension Father    Breast cancer Paternal Grandmother    Cancer Paternal Grandmother    Cancer Paternal Grandfather     ROS: Denies fever, fatigue, unexplained weight loss/gain, hearing or vision changes, cardiac or respiratory complaints. Denies neurological deficits, musculoskeletal complaints, gastrointestinal or genitourinary complaints, mental health complaints, and skin changes.   Objective:   Today's Vitals   04/11/23 0828  BP: 116/70  Pulse: 77  Temp: 98 F (36.7 C)  TempSrc: Temporal  SpO2: 100%  Weight: 196 lb 3.2 oz (89 kg)  Height: 5' 6.5" (1.689 m)    GENERAL APPEARANCE: Well-appearing, in NAD. Well nourished.  SKIN: Pink, warm and dry. Turgor normal. No rash, lesion, ulceration, or ecchymoses. Hair evenly distributed.  NECK: Supple, Trachea midline. Full ROM w/o pain or tenderness. No lymphadenopathy. Thyroid non-tender w/o enlargement or palpable masses.  RESPIRATORY: Chest wall symmetrical w/o masses. Respirations even and non-labored. Breath sounds clear to auscultation bilaterally. No wheezes, rales, rhonchi, or crackles. CARDIAC: S1, S2 present, regular rate and rhythm. No gallops, murmurs, rubs, or clicks. No carotid bruits. Capillary refill <2 seconds. Peripheral pulses 2+ bilaterally. GI: Abdomen soft w/o distention. Normoactive bowel sounds. No palpable masses or tenderness. No guarding or rebound tenderness. Liver and spleen w/o tenderness or enlargement. No CVA tenderness.  MSK: Muscle tone and strength appropriate for age, w/o atrophy or abnormal movement.  EXTREMITIES: Active ROM intact, w/o tenderness, crepitus, or contracture. No obvious joint deformities or effusions. No clubbing,  edema, or cyanosis.  NEUROLOGIC: CN's II-XII intact. Motor strength symmetrical with no obvious weakness. No sensory deficits. Steady, even gait.  PSYCH/MENTAL STATUS: Alert, oriented x 3. Cooperative, appropriate mood and affect.      Assessment & Plan:  Encounter for general adult medical examination without abnormal findings -     CBC with Differential/Platelet; Future -     Comprehensive metabolic panel; Future -     Lipid panel; Future -     TSH; Future  Immunization due -     Flu vaccine trivalent PF, 6mos and older(Flulaval,Afluria,Fluarix,Fluzone)    Orders Placed This Encounter  Procedures   Flu vaccine trivalent PF, 6mos and older(Flulaval,Afluria,Fluarix,Fluzone)   CBC with Differential/Platelet    Standing Status:   Future    Standing Expiration Date:   10/09/2023  Comprehensive metabolic panel    Standing Status:   Future    Standing Expiration Date:   10/09/2023   Lipid panel    Standing Status:   Future    Standing Expiration Date:   10/09/2023   TSH    Standing Status:   Future    Standing Expiration Date:   10/09/2023   HM PAP SMEAR    This external order was created through the Results Console.    PATIENT COUNSELING:  - Encouraged a healthy well-balanced diet. Patient may adjust caloric intake to maintain or achieve ideal body weight. May reduce intake of dietary saturated fat and total fat and have adequate dietary potassium and calcium preferably from fresh fruits, vegetables, and low-fat dairy products.   - Advised to avoid cigarette smoking. - Discussed with the patient that most people either abstain from alcohol or drink within safe limits (<=14/week and <=4 drinks/occasion for males, <=7/weeks and <= 3 drinks/occasion for females) and that the risk for alcohol disorders and other health effects rises proportionally with the number of drinks per week and how often a drinker exceeds daily limits. - Discussed cessation/primary prevention of drug use and  availability of treatment for abuse.  - Discussed sexually transmitted diseases, avoidance of unintended pregnancy and contraceptive alternatives.  - Stressed the importance of regular exercise - Injury prevention: Discussed safety belts, safety helmets, smoke detector, smoking near bedding or upholstery.  - Dental health: Discussed importance of regular tooth brushing, flossing, and dental visits.   NEXT PREVENTATIVE PHYSICAL DUE IN 1 YEAR.  Return in about 1 year (around 04/10/2024) for Fasting Annual Physical Exam and as needed.  Salvatore Decent, FNP

## 2023-04-12 ENCOUNTER — Other Ambulatory Visit: Payer: 59

## 2023-04-22 ENCOUNTER — Telehealth: Payer: Self-pay

## 2023-04-22 NOTE — Telephone Encounter (Signed)
LVM for pt to call the office and schedule a lab appt to have labs drawn. Orders are in future. [facility] Form needs to be completed once labs result in pt chart. Charge form needs to be attached. Form on LaVon's desk.

## 2023-04-26 ENCOUNTER — Other Ambulatory Visit: Payer: 59

## 2023-04-26 NOTE — Telephone Encounter (Signed)
Noted  

## 2023-05-02 ENCOUNTER — Other Ambulatory Visit: Payer: 59

## 2023-05-07 ENCOUNTER — Other Ambulatory Visit (INDEPENDENT_AMBULATORY_CARE_PROVIDER_SITE_OTHER): Payer: 59

## 2023-05-07 DIAGNOSIS — Z0000 Encounter for general adult medical examination without abnormal findings: Secondary | ICD-10-CM

## 2023-05-07 LAB — LIPID PANEL
Cholesterol: 135 mg/dL (ref 0–200)
HDL: 44.2 mg/dL (ref >39.00)
LDL Cholesterol: 61 mg/dL (ref 0–99)
NonHDL: 90.38
Total CHOL/HDL Ratio: 3
Triglycerides: 149 mg/dL (ref 0.0–149.0)
VLDL: 29.8 mg/dL (ref 0.0–40.0)

## 2023-05-07 LAB — CBC WITH DIFFERENTIAL/PLATELET
Basophils Absolute: 0 10*3/uL (ref 0.0–0.1)
Basophils Relative: 0.5 % (ref 0.0–3.0)
Eosinophils Absolute: 0.2 10*3/uL (ref 0.0–0.7)
Eosinophils Relative: 4.1 % (ref 0.0–5.0)
HCT: 40.5 % (ref 36.0–46.0)
Hemoglobin: 13.5 g/dL (ref 12.0–15.0)
Lymphocytes Relative: 20.4 % (ref 12.0–46.0)
Lymphs Abs: 1.2 10*3/uL (ref 0.7–4.0)
MCHC: 33.4 g/dL (ref 30.0–36.0)
MCV: 94.7 fL (ref 78.0–100.0)
Monocytes Absolute: 0.4 10*3/uL (ref 0.1–1.0)
Monocytes Relative: 7.5 % (ref 3.0–12.0)
Neutro Abs: 4 10*3/uL (ref 1.4–7.7)
Neutrophils Relative %: 67.5 % (ref 43.0–77.0)
Platelets: 264 10*3/uL (ref 150.0–400.0)
RBC: 4.27 Mil/uL (ref 3.87–5.11)
RDW: 12.2 % (ref 11.5–15.5)
WBC: 6 10*3/uL (ref 4.0–10.5)

## 2023-05-07 LAB — COMPREHENSIVE METABOLIC PANEL
ALT: 38 U/L — ABNORMAL HIGH (ref 0–35)
AST: 28 U/L (ref 0–37)
Albumin: 4.3 g/dL (ref 3.5–5.2)
Alkaline Phosphatase: 54 U/L (ref 39–117)
BUN: 9 mg/dL (ref 6–23)
CO2: 28 meq/L (ref 19–32)
Calcium: 8.9 mg/dL (ref 8.4–10.5)
Chloride: 101 meq/L (ref 96–112)
Creatinine, Ser: 0.61 mg/dL (ref 0.40–1.20)
GFR: 106.9 mL/min (ref >60.00)
Glucose, Bld: 110 mg/dL — ABNORMAL HIGH (ref 70–99)
Potassium: 4.3 meq/L (ref 3.5–5.1)
Sodium: 136 meq/L (ref 135–145)
Total Bilirubin: 0.9 mg/dL (ref 0.2–1.2)
Total Protein: 6.5 g/dL (ref 6.0–8.3)

## 2023-05-07 LAB — TSH: TSH: 1.9 u[IU]/mL (ref 0.35–5.50)

## 2023-07-02 ENCOUNTER — Ambulatory Visit: Payer: 59 | Admitting: Internal Medicine

## 2023-07-02 ENCOUNTER — Encounter: Payer: Self-pay | Admitting: Internal Medicine

## 2023-07-02 VITALS — BP 122/80 | HR 88 | Temp 97.9°F | Ht 66.5 in | Wt 200.4 lb

## 2023-07-02 DIAGNOSIS — M1712 Unilateral primary osteoarthritis, left knee: Secondary | ICD-10-CM

## 2023-07-02 MED ORDER — TRIAMCINOLONE ACETONIDE 40 MG/ML IJ SUSP
40.0000 mg | Freq: Once | INTRAMUSCULAR | Status: AC
Start: 2023-07-02 — End: 2023-07-02
  Administered 2023-07-02: 40 mg via INTRA_ARTICULAR

## 2023-07-02 MED ORDER — LIDOCAINE HCL 1 % IJ SOLN
1.0000 mL | Freq: Once | INTRAMUSCULAR | Status: AC
Start: 2023-07-02 — End: 2023-07-02
  Administered 2023-07-02: 1 mL

## 2023-07-02 NOTE — Progress Notes (Signed)
[facility] [address] [place] [address] Dept: [phone] Dept Fax: [phone]  Acute Care Office Visit  Subjective:   Gabriela Bean [birth year] 07/02/2023  Chief Complaint  Patient presents with   Knee Pain    Left knee    HPI: Discussed the use of AI scribe software for clinical note transcription with the patient, who gave verbal consent to proceed.  History of Present Illness   The patient presents with chronic left knee pain, which has been less severe today but over the past days - week it has been stiff and painful. They have a history of surgery on the same knee and have received two steroid injections in the past, the last of which was over a year ago. The pain is attributed to arthritis and overuse from walking. The patient reports no recent injuries or falls. They express frustration that they have been told they need a knee replacement, but have been denied due to their age (46 years). The patient is seeking treatment to manage their pain, particularly with the upcoming holiday season.       The following portions of the patient's history were reviewed and updated as appropriate: past medical history, past surgical history, family history, social history, allergies, medications, and problem list.   Patient Active Problem List   Diagnosis Date Noted   Anxiety disorder 04/11/2023   Primary osteoarthritis of both first carpometacarpal joints 08/05/2019   Genetic testing 01/27/2019   Family history of breast cancer    Family history of ovarian cancer    Family history of prostate cancer    Family history of BRCA2 gene positive    Pain in left knee 03/20/2018   ROM (rupture of membranes), premature 09/01/2012   Acute sinus infection 08/30/2012   Dyspnea and respiratory abnormality 08/30/2012   Allergic rhinitis 01/06/2009   Past Medical History:  Diagnosis Date   ALLERGIC RHINITIS 01/06/2009    Depression    Family history of BRCA2 gene positive    Family history of breast cancer    Family history of ovarian cancer    Family history of prostate cancer    Infertility, female    VBAC, delivered, current hospitalization 09/01/2012   Past Surgical History:  Procedure Laterality Date   CESAREAN SECTION  03/25/10   KNEE SURGERY Left 2010   torn NCL   Family History  Problem Relation Age of Onset   Diabetes Mother    Hypertension Mother    Hypertension Father    Breast cancer Paternal Grandmother    Cancer Paternal Grandmother    Cancer Paternal Grandfather     Current Outpatient Medications:    escitalopram (LEXAPRO) 20 MG tablet, Take 1 tablet by mouth daily., Disp: , Rfl:    levonorgestrel (MIRENA, 52 MG,) 20 MCG/DAY IUD, Take 1 device every day by intrauterine route., Disp: , Rfl:    loratadine (CLARITIN) 10 MG tablet, Take by mouth., Disp: , Rfl:  Allergies  Allergen Reactions   Pseudoephedrine Other (See Comments)   Latex      ROS: A complete ROS was performed with pertinent positives/negatives noted in the HPI. The remainder of the ROS are negative.    Objective:   Today's Vitals   07/02/23 0806  BP: 122/80  Pulse: 88  Temp: 97.9 F (36.6 C)  TempSrc: Temporal  SpO2: 99%  Weight: 200 lb 6.4 oz (90.9 kg)  Height: 5' 6.5" (1.689 m)    GENERAL: Well-appearing, in  NAD. Well nourished.  SKIN: Pink, warm and dry. No rash, lesion, ulceration, or ecchymoses.  RESPIRATORY: Chest wall symmetrical. Respirations even and non-labored.  CARDIAC: Peripheral pulses 2+ bilaterally.  MUSCULOSKELETAL: swelling to anterior left knee. Pain with ambulation .  EXTREMITIES: Without clubbing, cyanosis, or edema.  NEUROLOGIC: Steady, even gait.  PSYCH/MENTAL STATUS: Alert, oriented x 3. Cooperative, appropriate mood and affect.   KNEE JOINT INJECTION: Patient denies allergy to antiseptics (including iodine) and anesthetics.  Patient does not have h/o diabetes, frequent  steroid use, use of blood thinners/ antiplatelets.  Patient was provided informed consent. Patient verbally agreed to procedure. Appropriate time out was taken. Area prepped and draped in usual sterile fashion. Anatomic landmarks were identified and injection site was marked.  Ethyl chloride spray was used to numb the area and 1 cc of triamcinolone 40 mg/ml plus  1 cc of 1% lidocaine without epinephrine was injected into the left knee using an anteromedial approach. The patient tolerated the procedure well and there were no immediate complications. Estimated blood loss is less than 5 cc.  Post procedure instructions were reviewed. Concerning signs and symptoms reviewed with patient, and instructed to seek immediate medical care if such signs/symptoms occur. Patient verbalized understanding with plan of care.    No results found for any visits on 07/02/23.    Assessment & Plan:  Assessment and Plan    Left Knee Osteoarthritis Chronic pain and stiffness, no recent injury. Prior surgical intervention and two steroid injections, last over a year ago. No current swelling. Patient declined for knee replacement due to age. -Administer steroid injection today to alleviate inflammation and pain. -Discussed risks of procedure including infection and bleeding. -Advised patient that pain may increase slightly post-procedure before improvement is noticed.       Meds ordered this encounter  Medications   triamcinolone acetonide (KENALOG-40) injection 40 mg   lidocaine (XYLOCAINE) 1 % (with pres) injection 1 mL   Lab Orders  No laboratory test(s) ordered today   No images are attached to the encounter or orders placed in the encounter.  Return for Scheduled Routine Office Visits and as needed.   Salvatore Decent, FNP

## 2023-08-19 ENCOUNTER — Ambulatory Visit: Payer: 59 | Admitting: Internal Medicine

## 2023-09-18 ENCOUNTER — Telehealth: Payer: Self-pay

## 2023-09-18 NOTE — Telephone Encounter (Signed)
 I have not seen forms.

## 2023-09-18 NOTE — Telephone Encounter (Signed)
 Copied from CRM [id number]. Topic: General - Other >> Sep 18, 2023 11:14 AM Aletta Edouard wrote: Reason for CRM: sherry is calling in to see if patient medical clearance form has been sent over to the office

## 2023-09-18 NOTE — Telephone Encounter (Signed)
 Forwarding message below

## 2023-09-19 NOTE — Telephone Encounter (Signed)
 Spoke to Gabriela Bean on 09/19/2023 regarding surgery clearance and it will be re faxed today. Pt surgery date is set for October 17, 2023.

## 2023-09-19 NOTE — Telephone Encounter (Signed)
 Type of forms received: Pre-op  Routed to:  Paperwork received by : Daelynn Blower   Individual made aware of 3-5 business day turn around (Y/N):  Form completed and patient made aware of charges(Y/N):   Faxed to :   Form location:  Holding until appt 10/08/23

## 2023-10-07 NOTE — Progress Notes (Unsigned)
 [facility] [address] [place] [address] Dept: [phone] Dept Fax: [phone]  Acute Care Office Visit  Subjective:   Gabriela Bean [birth year] 10/08/2023  No chief complaint on file.   HPI: Discussed the use of AI scribe software for clinical note transcription with the patient, who gave verbal consent to proceed.  History of Present Illness             The following portions of the patient's history were reviewed and updated as appropriate: past medical history, past surgical history, family history, social history, allergies, medications, and problem list.   Patient Active Problem List   Diagnosis Date Noted   Anxiety disorder 04/11/2023   Primary osteoarthritis of both first carpometacarpal joints 08/05/2019   Genetic testing 01/27/2019   Family history of breast cancer    Family history of ovarian cancer    Family history of prostate cancer    Family history of BRCA2 gene positive    Pain in left knee 03/20/2018   ROM (rupture of membranes), premature 09/01/2012   Acute sinus infection 08/30/2012   Dyspnea and respiratory abnormality 08/30/2012   Allergic rhinitis 01/06/2009   Past Medical History:  Diagnosis Date   ALLERGIC RHINITIS 01/06/2009   Depression    Family history of BRCA2 gene positive    Family history of breast cancer    Family history of ovarian cancer    Family history of prostate cancer    Infertility, female    VBAC, delivered, current hospitalization 09/01/2012   Past Surgical History:  Procedure Laterality Date   CESAREAN SECTION  03/25/10   KNEE SURGERY Left 2010   torn NCL   Family History  Problem Relation Age of Onset   Diabetes Mother    Hypertension Mother    Hypertension Father    Breast cancer Paternal Grandmother    Cancer Paternal Grandmother    Cancer Paternal Grandfather     Current Outpatient Medications:    escitalopram (LEXAPRO) 20 MG tablet,  Take 1 tablet by mouth daily., Disp: , Rfl:    levonorgestrel (MIRENA, 52 MG,) 20 MCG/DAY IUD, Take 1 device every day by intrauterine route., Disp: , Rfl:    loratadine (CLARITIN) 10 MG tablet, Take by mouth., Disp: , Rfl:  Allergies  Allergen Reactions   Pseudoephedrine Other (See Comments)   Latex      ROS: A complete ROS was performed with pertinent positives/negatives noted in the HPI. The remainder of the ROS are negative.    Objective:   There were no vitals filed for this visit.  GENERAL: Well-appearing, in NAD. Well nourished.  SKIN: Pink, warm and dry. No rash, lesion, ulceration, or ecchymoses.  NECK: Trachea midline. Full ROM w/o pain or tenderness. No lymphadenopathy.  RESPIRATORY: Chest wall symmetrical. Respirations even and non-labored. Breath sounds clear to auscultation bilaterally.  CARDIAC: S1, S2 present, regular rate and rhythm. Peripheral pulses 2+ bilaterally.  MSK: Muscle tone and strength appropriate for age. Joints w/o tenderness, redness, or swelling. EXTREMITIES: Without clubbing, cyanosis, or edema.  NEUROLOGIC: No motor or sensory deficits. Steady, even gait.  PSYCH/MENTAL STATUS: Alert, oriented x 3. Cooperative, appropriate mood and affect.   EKG RESULT: EKG tracing is personally reviewed.   EKG: {ekg findings:315101}.    No results found for any visits on 10/08/23.    Assessment & Plan:  Assessment and Plan               There are no  diagnoses linked to this encounter. No orders of the defined types were placed in this encounter.  No orders of the defined types were placed in this encounter.  Lab Orders  No laboratory test(s) ordered today   No images are attached to the encounter or orders placed in the encounter.  No follow-ups on file.   Salvatore Decent, FNP

## 2023-10-08 ENCOUNTER — Ambulatory Visit: Admitting: Internal Medicine

## 2023-10-08 ENCOUNTER — Encounter: Payer: Self-pay | Admitting: Internal Medicine

## 2023-10-08 VITALS — BP 122/84 | HR 82 | Temp 98.2°F | Ht 66.5 in | Wt 198.6 lb

## 2023-10-08 DIAGNOSIS — Z01818 Encounter for other preprocedural examination: Secondary | ICD-10-CM

## 2023-10-08 LAB — CBC WITH DIFFERENTIAL/PLATELET
Basophils Absolute: 0 10*3/uL (ref 0.0–0.1)
Basophils Relative: 0.6 % (ref 0.0–3.0)
Eosinophils Absolute: 0.2 10*3/uL (ref 0.0–0.7)
Eosinophils Relative: 2.6 % (ref 0.0–5.0)
HCT: 39.7 % (ref 36.0–46.0)
Hemoglobin: 13.7 g/dL (ref 12.0–15.0)
Lymphocytes Relative: 22.9 % (ref 12.0–46.0)
Lymphs Abs: 1.4 10*3/uL (ref 0.7–4.0)
MCHC: 34.6 g/dL (ref 30.0–36.0)
MCV: 94.3 fl (ref 78.0–100.0)
Monocytes Absolute: 0.4 10*3/uL (ref 0.1–1.0)
Monocytes Relative: 7.4 % (ref 3.0–12.0)
Neutro Abs: 4 10*3/uL (ref 1.4–7.7)
Neutrophils Relative %: 66.5 % (ref 43.0–77.0)
Platelets: 273 10*3/uL (ref 150.0–400.0)
RBC: 4.21 Mil/uL (ref 3.87–5.11)
RDW: 12.6 % (ref 11.5–15.5)
WBC: 6.1 10*3/uL (ref 4.0–10.5)

## 2023-10-08 LAB — BASIC METABOLIC PANEL
BUN: 11 mg/dL (ref 6–23)
CO2: 27 meq/L (ref 19–32)
Calcium: 9 mg/dL (ref 8.4–10.5)
Chloride: 100 meq/L (ref 96–112)
Creatinine, Ser: 0.58 mg/dL (ref 0.40–1.20)
GFR: 107.89 mL/min (ref >60.00)
Glucose, Bld: 111 mg/dL — ABNORMAL HIGH (ref 70–99)
Potassium: 4.3 meq/L (ref 3.5–5.1)
Sodium: 135 meq/L (ref 135–145)

## 2023-10-08 NOTE — Patient Instructions (Signed)
 EKG normal  Waiting on lab results  Will fax over clearance form to surgeon once labs received.

## 2023-10-09 ENCOUNTER — Encounter: Payer: Self-pay | Admitting: Internal Medicine

## 2023-10-09 ENCOUNTER — Other Ambulatory Visit: Payer: Self-pay | Admitting: Internal Medicine

## 2023-10-09 ENCOUNTER — Ambulatory Visit (INDEPENDENT_AMBULATORY_CARE_PROVIDER_SITE_OTHER)

## 2023-10-09 DIAGNOSIS — R7303 Prediabetes: Secondary | ICD-10-CM | POA: Insufficient documentation

## 2023-10-09 DIAGNOSIS — R7301 Impaired fasting glucose: Secondary | ICD-10-CM

## 2023-10-09 LAB — HEMOGLOBIN A1C: Hgb A1c MFr Bld: 5.7 % (ref 4.6–6.5)

## 2023-10-21 ENCOUNTER — Ambulatory Visit: Attending: Orthopedic Surgery | Admitting: Physical Therapy

## 2023-10-21 ENCOUNTER — Encounter: Payer: Self-pay | Admitting: Physical Therapy

## 2023-10-21 ENCOUNTER — Other Ambulatory Visit: Payer: Self-pay

## 2023-10-21 DIAGNOSIS — M25562 Pain in left knee: Secondary | ICD-10-CM | POA: Diagnosis present

## 2023-10-21 DIAGNOSIS — R262 Difficulty in walking, not elsewhere classified: Secondary | ICD-10-CM | POA: Diagnosis present

## 2023-10-21 DIAGNOSIS — M6281 Muscle weakness (generalized): Secondary | ICD-10-CM | POA: Diagnosis present

## 2023-10-21 DIAGNOSIS — M25662 Stiffness of left knee, not elsewhere classified: Secondary | ICD-10-CM

## 2023-10-21 DIAGNOSIS — G8929 Other chronic pain: Secondary | ICD-10-CM | POA: Diagnosis present

## 2023-10-21 NOTE — Therapy (Signed)
 OUTPATIENT PHYSICAL THERAPY LOWER EXTREMITY EVALUATION   Patient Name: Gabriela Bean MRN: [id number] DOB:1975/10/09, 48 y.o., female Today's Date: 10/21/2023  END OF SESSION:  PT End of Session - 10/21/23 1017     Visit Number 1    Number of Visits 16    Date for PT Re-Evaluation 12/16/23    Authorization Type [employer]    PT Start Time 1017    PT Stop Time 1055    PT Time Calculation (min) 38 min    Activity Tolerance Patient limited by pain    Behavior During Therapy Lability             Past Medical History:  Diagnosis Date   ALLERGIC RHINITIS 01/06/2009   Depression    Family history of BRCA2 gene positive    Family history of breast cancer    Family history of ovarian cancer    Family history of prostate cancer    Infertility, female    VBAC, delivered, current hospitalization 09/01/2012   Past Surgical History:  Procedure Laterality Date   CESAREAN SECTION  03/25/10   KNEE SURGERY Left 2010   torn NCL   Patient Active Problem List   Diagnosis Date Noted   Prediabetes 10/09/2023   Anxiety disorder 04/11/2023   Primary osteoarthritis of both first carpometacarpal joints 08/05/2019   Genetic testing 01/27/2019   Family history of breast cancer    Family history of ovarian cancer    Family history of prostate cancer    Family history of BRCA2 gene positive    Pain in left knee 03/20/2018   ROM (rupture of membranes), premature 09/01/2012   Acute sinus infection 08/30/2012   Dyspnea and respiratory abnormality 08/30/2012   Allergic rhinitis 01/06/2009    PCP: Salvatore Decent, FNP  REFERRING PROVIDER: Teryl Lucy, MD  REFERRING DIAG: [phone] (ICD-10-CM) - S/P total knee replacement  THERAPY DIAG:  Chronic pain of left knee  Stiffness of left knee, not elsewhere classified  Muscle weakness (generalized)  Difficulty in walking, not elsewhere classified  Rationale for Evaluation and Treatment: Rehabilitation  ONSET DATE: 10/17/23  SUBJECTIVE:    SUBJECTIVE STATEMENT: Pt reports a lot of swelling and she had a lot of bleeding. Has been icing and getting up every hour. Pt was told not to do all of the take home exercises. Will be seeing ortho this afternoon.   PERTINENT HISTORY: L sciatica PAIN:  Are you having pain? Yes: NPRS scale: 7/10 currently Pain location: anterior knee and back of thigh Pain description: dull aching in thigh, sharpness in knee Aggravating factors: Walking, transfers Relieving factors: ice, pain medicine  PRECAUTIONS: None  RED FLAGS: None   WEIGHT BEARING RESTRICTIONS: No  FALLS:  Has patient fallen in last 6 months? No  LIVING ENVIRONMENT: Lives with: lives with their family son and daughter Lives in: House/apartment Stairs: Yes: Internal: 13 steps; on left going up and External: 3 steps; on right going up (staying downstairs for now) Has following equipment at home: Single point cane and Walker - 2 wheeled  OCCUPATION: Therapist -- mostly desk work   PLOF: Independent  PATIENT GOALS: Return to work and normal activities  NEXT MD VISIT:   OBJECTIVE:  Note: Objective measures were completed at Evaluation unless otherwise noted.  DIAGNOSTIC FINDINGS: nothing new in Epic  PATIENT SURVEYS:  LEFS 6/80 = 7.5%  COGNITION: Overall cognitive status: Within functional limits for tasks assessed     SENSATION: A little numbness on the front  EDEMA:  Circumferential: did not assess as pt still has ACE wrap compression and was told by ortho to keep it until follow up today  MUSCLE LENGTH: Hamstrings: Left limited due to edema Thomas test: Did not assess  POSTURE: weight shift right and L knee flexed  PALPATION: TTP ant and posterior L thigh, ant knee  LOWER EXTREMITY ROM:  Active ROM Right eval Left eval  Hip flexion    Hip extension    Hip abduction    Hip adduction    Hip internal rotation    Hip external rotation    Knee flexion 115 AROM 75 AROM 80 PROM  Knee  extension -5 AROM -40 AROM -25 PROM  Ankle dorsiflexion    Ankle plantarflexion    Ankle inversion    Ankle eversion     (Blank rows = not tested)  LOWER EXTREMITY MMT:  MMT Right eval Left eval  Hip flexion 5 4*  Hip extension    Hip abduction    Hip adduction    Hip internal rotation    Hip external rotation    Knee flexion 5 4  Knee extension 5 2+  Ankle dorsiflexion 5 5  Ankle plantarflexion    Ankle inversion    Ankle eversion     (Blank rows = not tested, * = pain)  LOWER EXTREMITY SPECIAL TESTS:  Did not assess  FUNCTIONAL TESTS:  5 times sit to stand: 15.79 sec DGI: 12/24  [facility] PT Assessment - 10/21/23 0001       Standardized Balance Assessment   Standardized Balance Assessment Dynamic Gait Index      Dynamic Gait Index   Level Surface Moderate Impairment    Change in Gait Speed Mild Impairment    Gait with Horizontal Head Turns Mild Impairment    Gait with Vertical Head Turns Mild Impairment    Gait and Pivot Turn Mild Impairment    Step Over Obstacle Severe Impairment    Step Around Obstacles Mild Impairment    Steps Moderate Impairment    Total Score 12              GAIT: Distance walked: Into clinic Assistive device utilized: Walker - 2 wheeled Level of assistance: Modified independence Comments: Antalgic, decreased L knee flexion during swing, decreased extension during stance, early step through pattern                                                                                                                                TREATMENT DATE: 10/21/23  See initial HEP below   PATIENT EDUCATION:  Education details: Exam findings, POC, initial HEP, RICE for edema Person educated: Patient Education method: Explanation, Demonstration, and Handouts Education comprehension: verbalized understanding, returned demonstration, and needs further education  HOME EXERCISE PROGRAM: Access Code: [email or web address] URL:  [email or web address] Date: 10/21/2023 Prepared by: Vernon Prey April Kirstie Peri  Exercises - Supine Quad Set  - 1 x daily - 7 x  weekly - 2 sets - 10 reps - 3 sec hold - Supine Knee Extension Strengthening  - 1 x daily - 7 x weekly - 2 sets - 10 reps - Hooklying Gluteal Sets  - 1 x daily - 7 x weekly - 2 sets - 10 reps - Bent Knee Fallouts  - 1 x daily - 7 x weekly - 2 sets - 10 reps  ASSESSMENT:  CLINICAL IMPRESSION: Patient is a 48 y.o. F who was seen today for physical therapy evaluation and treatment for L TKR on 10/17/23. PMH significant for prior L knee surgery (did not have full range) as well as L side sciatica. Assessment significant for decreased L knee ROM, poor quad activation, edema and L>R LE weakness leading to decreased balance, gait dysfunction, and transfers affecting home and community mobility. Pt will benefit from PT to address these deficits for return to her baseline function.   OBJECTIVE IMPAIRMENTS: Abnormal gait, decreased activity tolerance, decreased balance, decreased endurance, decreased mobility, difficulty walking, decreased ROM, decreased strength, hypomobility, increased edema, increased fascial restrictions, increased muscle spasms, impaired flexibility, impaired sensation, postural dysfunction, and pain.   ACTIVITY LIMITATIONS: carrying, lifting, bending, sitting, standing, squatting, sleeping, stairs, transfers, bed mobility, bathing, toileting, dressing, hygiene/grooming, locomotion level, and caring for others  PARTICIPATION LIMITATIONS: meal prep, cleaning, laundry, driving, shopping, community activity, occupation, and yard work  PERSONAL FACTORS: Age, Fitness, Past/current experiences, and Time since onset of injury/illness/exacerbation are also affecting patient's functional outcome.   REHAB POTENTIAL: Good  CLINICAL DECISION MAKING: Evolving/moderate complexity  EVALUATION COMPLEXITY: Moderate   GOALS: Goals reviewed with patient?  Yes   SHORT TERM GOALS: Target date: 11/18/2023   Independent with initial HEP. Baseline: Newly issued on eval Goal status: INITIAL  2.  Pt will have improved L knee ROM from to 10-90 deg Baseline: 25-80 PROM, 40-75 AROM Goal status: INITIAL  3.  Pt will be able to wean to [facility] for home mobility utilizing reciprocal gait pattern Baseline: RW utilized with early step through pattern Goal status: INITIAL   LONG TERM GOALS: Target date: 12/16/2023  Independent with advanced/ongoing HEP to improve outcomes and carryover.  Baseline:  Goal status: INITIAL  2.  Janice Coffin will demonstrate L knee flexion to 110 deg to ascend/descend stairs. Baseline: 80 deg AROM Goal status: INITIAL  3.  Janice Coffin will demonstrate L knee extension = to R knee for safety with gait. Baseline: R knee = 5 deg from full ext, L knee = 25 deg from full ext Goal status: INITIAL  4.  Shaunita Seney will be able to ambulate 1000' ind and normal gait pattern to access community/work.  Baseline: currently using RW Goal status: INITIAL  5.  Iyahna Obriant will be able to ascend/descend stairs with 1 HR and reciprocal step pattern safely to access home and community.  Baseline: not performing stairs Goal status: INITIAL  6.  Celine Dishman will demonstrate > 19/24 on DGI to demonstrate decreased risk of falls.   Baseline: 12/24 Goal status: INITIAL  7.  Janice Coffin will demonstrate improved functional LE strength by completing 5x STS in <10 seconds.  (MCID 5 seconds) Baseline: 15.79 sec Goal status: INITIAL  8.  Kalany Diekmann will have improved LEFS to >/=15/80 to demo MCID Baseline: 6/80 Goal status: INITIAL   PLAN:  PT FREQUENCY: 2x/week  PT DURATION: 8 weeks  PLANNED INTERVENTIONS: 97164- PT Re-evaluation, 97110-Therapeutic exercises, 97530- Therapeutic activity, 97112- Neuromuscular re-education, 97535- Self Care, 91478- Manual therapy, [phone]- Gait training,  16109- Aquatic Therapy, [id number]-  Electrical stimulation (unattended), [id number]- Ultrasound, 60454- Ionotophoresis 4mg /ml Dexamethasone, Patient/Family education, Balance training, Stair training, Taping, Dry Needling, Joint mobilization, Spinal mobilization, Cryotherapy, and Moist heat  PLAN FOR NEXT SESSION: How was ortho appointment? Work on quad firing, knee ROM, and general L LE strengthening. Gait training   Lilybeth Vien April Ma L [place], PT 10/21/2023, 12:52 PM

## 2023-10-23 ENCOUNTER — Other Ambulatory Visit: Payer: Self-pay

## 2023-10-23 ENCOUNTER — Ambulatory Visit

## 2023-10-23 DIAGNOSIS — M25662 Stiffness of left knee, not elsewhere classified: Secondary | ICD-10-CM

## 2023-10-23 DIAGNOSIS — M25562 Pain in left knee: Secondary | ICD-10-CM | POA: Diagnosis not present

## 2023-10-23 DIAGNOSIS — M6281 Muscle weakness (generalized): Secondary | ICD-10-CM

## 2023-10-23 DIAGNOSIS — R262 Difficulty in walking, not elsewhere classified: Secondary | ICD-10-CM

## 2023-10-23 DIAGNOSIS — G8929 Other chronic pain: Secondary | ICD-10-CM

## 2023-10-23 NOTE — Therapy (Signed)
 OUTPATIENT PHYSICAL THERAPY LOWER EXTREMITY EVALUATION   Patient Name: Gabriela Bean MRN: [id number] DOB:08-25-1975, 48 y.o., female Today's Date: 10/23/2023  END OF SESSION:  PT End of Session - 10/23/23 1131     Visit Number 2    Number of Visits 16    Date for PT Re-Evaluation 12/16/23    Authorization Type [employer]    Progress Note Due on Visit 10    PT Start Time 1015    PT Stop Time 1055    PT Time Calculation (min) 40 min    Activity Tolerance Patient tolerated treatment well;Patient limited by pain    Behavior During Therapy [facility] for tasks assessed/performed              Past Medical History:  Diagnosis Date   ALLERGIC RHINITIS 01/06/2009   Depression    Family history of BRCA2 gene positive    Family history of breast cancer    Family history of ovarian cancer    Family history of prostate cancer    Infertility, female    VBAC, delivered, current hospitalization 09/01/2012   Past Surgical History:  Procedure Laterality Date   CESAREAN SECTION  03/25/10   KNEE SURGERY Left 2010   torn NCL   Patient Active Problem List   Diagnosis Date Noted   Prediabetes 10/09/2023   Anxiety disorder 04/11/2023   Primary osteoarthritis of both first carpometacarpal joints 08/05/2019   Genetic testing 01/27/2019   Family history of breast cancer    Family history of ovarian cancer    Family history of prostate cancer    Family history of BRCA2 gene positive    Pain in left knee 03/20/2018   ROM (rupture of membranes), premature 09/01/2012   Acute sinus infection 08/30/2012   Dyspnea and respiratory abnormality 08/30/2012   Allergic rhinitis 01/06/2009    PCP: Salvatore Decent, FNP  REFERRING PROVIDER: Teryl Lucy, MD  REFERRING DIAG: [phone] (ICD-10-CM) - S/P total knee replacement  THERAPY DIAG:  Chronic pain of left knee  Stiffness of left knee, not elsewhere classified  Muscle weakness (generalized)  Difficulty in walking, not elsewhere  classified  Rationale for Evaluation and Treatment: Rehabilitation  ONSET DATE: 10/17/23 L TKA  SUBJECTIVE:   SUBJECTIVE STATEMENT:  10/23/23: took muscle relaxer and pain meds prior to session.  L sciatica still there Eval: Pt reports a lot of swelling and she had a lot of bleeding. Has been icing and getting up every hour. Pt was told not to do all of the take home exercises. Will be seeing ortho this afternoon.   PERTINENT HISTORY: L sciatica PAIN:  Are you having pain? Yes: NPRS scale: 7/10 currently Pain location: anterior knee and back of thigh Pain description: dull aching in thigh, sharpness in knee Aggravating factors: Walking, transfers Relieving factors: ice, pain medicine  PRECAUTIONS: None  RED FLAGS: None   WEIGHT BEARING RESTRICTIONS: No  FALLS:  Has patient fallen in last 6 months? No  LIVING ENVIRONMENT: Lives with: lives with their family son and daughter Lives in: House/apartment Stairs: Yes: Internal: 13 steps; on left going up and External: 3 steps; on right going up (staying downstairs for now) Has following equipment at home: Single point cane and Walker - 2 wheeled  OCCUPATION: Therapist -- mostly desk work   PLOF: Independent  PATIENT GOALS: Return to work and normal activities  NEXT MD VISIT:   OBJECTIVE:  Note: Objective measures were completed at Evaluation unless otherwise noted.  DIAGNOSTIC FINDINGS: nothing new  in Epic  PATIENT SURVEYS:  LEFS 6/80 = 7.5%  SENSATION: A little numbness on the front  EDEMA:  Circumferential: did not assess as pt still has ACE wrap compression and was told by ortho to keep it until follow up today  MUSCLE LENGTH: Hamstrings: Left limited due to edema Thomas test: Did not assess  POSTURE: weight shift right and L knee flexed  PALPATION: TTP ant and posterior L thigh, ant knee  LOWER EXTREMITY ROM:  Active ROM Right eval Left eval 10/23/23  Hip flexion     Hip extension     Hip abduction      Hip adduction     Hip internal rotation     Hip external rotation     Knee flexion 115 AROM 75 AROM 80 PROM 83 AROM  Knee extension -5 AROM -40 AROM -25 PROM   Ankle dorsiflexion     Ankle plantarflexion     Ankle inversion     Ankle eversion      (Blank rows = not tested)  LOWER EXTREMITY MMT:  MMT Right eval Left eval  Hip flexion 5 4*  Hip extension    Hip abduction    Hip adduction    Hip internal rotation    Hip external rotation    Knee flexion 5 4  Knee extension 5 2+  Ankle dorsiflexion 5 5  Ankle plantarflexion    Ankle inversion    Ankle eversion     (Blank rows = not tested, * = pain)  LOWER EXTREMITY SPECIAL TESTS:  Did not assess  FUNCTIONAL TESTS:  5 times sit to stand: 15.79 sec DGI: 12/24     GAIT: Distance walked: Into clinic Assistive device utilized: Environmental consultant - 2 wheeled Level of assistance: Modified independence Comments: Antalgic, decreased L knee flexion during swing, decreased extension during stance, early step through pattern                                                                                                                                TREATMENT DATE:  10/23/23 Therapeutic exercise:  Supine for quad sets over 2 towel rolls, used towel roll for tactile stimulus to engage quads better 10 reps Supine for assisted SLR 10 reps Seated L knee flexion slides with foot on pillow case 10 reps Assisted long arc quads L , therapist providing assistance, then transitioned to pt performing with assist with black theraband, 15 reps total cues to sit with thigh supported to avoid utilizing L hip flexors  Therapeutic activity: Raised front of walker for better biomechanical positioning Instructed to continuously move walker, pt using step to pattern, encouraged to achieve toe off B to improve L knee extension mid stance as well as L hip extension Nustep. Level1, mid range for 3 min Ue's and LE's   10/21/23  See initial HEP  below   PATIENT EDUCATION:  Education details: Exam findings, POC, initial HEP, RICE for  edema Person educated: Patient Education method: Explanation, Demonstration, and Handouts Education comprehension: verbalized understanding, returned demonstration, and needs further education  HOME EXERCISE PROGRAM: Access Code: [email or web address] URL: [email or web address] Date: 10/21/2023 Prepared by: Vernon Prey April Kirstie Peri  Exercises - Supine Quad Set  - 1 x daily - 7 x weekly - 2 sets - 10 reps - 3 sec hold - Supine Knee Extension Strengthening  - 1 x daily - 7 x weekly - 2 sets - 10 reps - Hooklying Gluteal Sets  - 1 x daily - 7 x weekly - 2 sets - 10 reps - Bent Knee Fallouts  - 1 x daily - 7 x weekly - 2 sets - 10 reps  ASSESSMENT:  CLINICAL IMPRESSION: 10/23/23: 2nd physical therapy session and 6 days post op from L TKA.   Incision covered with aquacel, bruising post leg in calf, knee, mild edema L knee and thigh. Tolerated session well, achieved better quad control and better flexion ROM, also pain in L hip may be related somewhat to her chronic gait with L hip flexed, also tried to address today.  Added the assisted long arc quads to her home routine.     From eval: Patient is a 48 y.o. F who was seen today for physical therapy evaluation and treatment for L TKR on 10/17/23. PMH significant for prior L knee surgery (did not have full range) as well as L side sciatica. Assessment significant for decreased L knee ROM, poor quad activation, edema and L>R LE weakness leading to decreased balance, gait dysfunction, and transfers affecting home and community mobility. Pt will benefit from PT to address these deficits for return to her baseline function.   OBJECTIVE IMPAIRMENTS: Abnormal gait, decreased activity tolerance, decreased balance, decreased endurance, decreased mobility, difficulty walking, decreased ROM, decreased strength, hypomobility, increased edema, increased fascial  restrictions, increased muscle spasms, impaired flexibility, impaired sensation, postural dysfunction, and pain.     GOALS: Goals reviewed with patient? Yes   SHORT TERM GOALS: Target date: 11/18/2023   Independent with initial HEP. Baseline: Newly issued on eval Goal status: INITIAL  2.  Pt will have improved L knee ROM from to 10-90 deg Baseline: 25-80 PROM, 40-75 AROM Goal status: INITIAL  3.  Pt will be able to wean to [facility] for home mobility utilizing reciprocal gait pattern Baseline: RW utilized with early step through pattern Goal status: INITIAL   LONG TERM GOALS: Target date: 12/16/2023  Independent with advanced/ongoing HEP to improve outcomes and carryover.  Baseline:  Goal status: INITIAL  2.  Janice Coffin will demonstrate L knee flexion to 110 deg to ascend/descend stairs. Baseline: 80 deg AROM Goal status: INITIAL  3.  Janice Coffin will demonstrate L knee extension = to R knee for safety with gait. Baseline: R knee = 5 deg from full ext, L knee = 25 deg from full ext Goal status: INITIAL  4.  Raychell Holcomb will be able to ambulate 1000' ind and normal gait pattern to access community/work.  Baseline: currently using RW Goal status: INITIAL  5.  Kalista Laguardia will be able to ascend/descend stairs with 1 HR and reciprocal step pattern safely to access home and community.  Baseline: not performing stairs Goal status: INITIAL  6.  Gricel Copen will demonstrate > 19/24 on DGI to demonstrate decreased risk of falls.   Baseline: 12/24 Goal status: INITIAL  7.  Janice Coffin will demonstrate improved functional LE strength by completing 5x STS in <10 seconds.  (MCID 5 seconds)  Baseline: 15.79 sec Goal status: INITIAL  8.  Nalayah Hitt will have improved LEFS to >/=15/80 to demo MCID Baseline: 6/80 Goal status: INITIAL   PLAN:  PT FREQUENCY: 2x/week  PT DURATION: 8 weeks  PLANNED INTERVENTIONS: 97164- PT Re-evaluation, 97110-Therapeutic exercises,  97530- Therapeutic activity, 97112- Neuromuscular re-education, 97535- Self Care, 40981- Manual therapy, [phone]- Gait training, [phone]- Aquatic Therapy, [phone]- Electrical stimulation (unattended), 97035- Ultrasound, 65784- Ionotophoresis 4mg /ml Dexamethasone, Patient/Family education, Balance training, Stair training, Taping, Dry Needling, Joint mobilization, Spinal mobilization, Cryotherapy, and Moist heat  PLAN FOR NEXT SESSION: Work on quad firing, knee ROM, and general L LE strengthening. Gait training   Amy L Speaks, PT, DPT, OCS 10/23/2023, 11:33 AM

## 2023-10-25 ENCOUNTER — Ambulatory Visit

## 2023-10-25 DIAGNOSIS — G8929 Other chronic pain: Secondary | ICD-10-CM

## 2023-10-25 DIAGNOSIS — M6281 Muscle weakness (generalized): Secondary | ICD-10-CM

## 2023-10-25 DIAGNOSIS — M25662 Stiffness of left knee, not elsewhere classified: Secondary | ICD-10-CM

## 2023-10-25 DIAGNOSIS — R262 Difficulty in walking, not elsewhere classified: Secondary | ICD-10-CM

## 2023-10-25 DIAGNOSIS — M25562 Pain in left knee: Secondary | ICD-10-CM | POA: Diagnosis not present

## 2023-10-25 NOTE — Therapy (Signed)
 OUTPATIENT PHYSICAL THERAPY LOWER EXTREMITY TREATMENT   Patient Name: Gabriela Bean MRN: [id number] DOB:Dec 19, 1975, 48 y.o., female Today's Date: 10/25/2023  END OF SESSION:  PT End of Session - 10/25/23 1113     Visit Number 3    Number of Visits 16    Date for PT Re-Evaluation 12/16/23    Authorization Type [employer]    Progress Note Due on Visit 10    PT Start Time 1105    PT Stop Time 1200    PT Time Calculation (min) 55 min    Activity Tolerance Patient tolerated treatment well;Patient limited by pain    Behavior During Therapy WFL for tasks assessed/performed               Past Medical History:  Diagnosis Date   ALLERGIC RHINITIS 01/06/2009   Depression    Family history of BRCA2 gene positive    Family history of breast cancer    Family history of ovarian cancer    Family history of prostate cancer    Infertility, female    VBAC, delivered, current hospitalization 09/01/2012   Past Surgical History:  Procedure Laterality Date   CESAREAN SECTION  03/25/10   KNEE SURGERY Left 2010   torn NCL   Patient Active Problem List   Diagnosis Date Noted   Prediabetes 10/09/2023   Anxiety disorder 04/11/2023   Primary osteoarthritis of both first carpometacarpal joints 08/05/2019   Genetic testing 01/27/2019   Family history of breast cancer    Family history of ovarian cancer    Family history of prostate cancer    Family history of BRCA2 gene positive    Pain in left knee 03/20/2018   ROM (rupture of membranes), premature 09/01/2012   Acute sinus infection 08/30/2012   Dyspnea and respiratory abnormality 08/30/2012   Allergic rhinitis 01/06/2009    PCP: Salvatore Decent, FNP  REFERRING PROVIDER: Teryl Lucy, MD  REFERRING DIAG: [phone] (ICD-10-CM) - S/P total knee replacement  THERAPY DIAG:  Chronic pain of left knee  Stiffness of left knee, not elsewhere classified  Muscle weakness (generalized)  Difficulty in walking, not elsewhere  classified  Rationale for Evaluation and Treatment: Rehabilitation  ONSET DATE: 10/17/23 L TKA  SUBJECTIVE:   SUBJECTIVE STATEMENT:   Pt reports that she caught her foot on floor downstairs did not fall. Pain is an 8/10 Eval: Pt reports a lot of swelling and she had a lot of bleeding. Has been icing and getting up every hour. Pt was told not to do all of the take home exercises. Will be seeing ortho this afternoon.   PERTINENT HISTORY: L sciatica PAIN:  Are you having pain? Yes: NPRS scale: 8/10 currently Pain location: anterior knee and back of thigh Pain description: dull aching in thigh, sharpness in knee Aggravating factors: Walking, transfers Relieving factors: ice, pain medicine  PRECAUTIONS: None  RED FLAGS: None   WEIGHT BEARING RESTRICTIONS: No  FALLS:  Has patient fallen in last 6 months? No  LIVING ENVIRONMENT: Lives with: lives with their family son and daughter Lives in: House/apartment Stairs: Yes: Internal: 13 steps; on left going up and External: 3 steps; on right going up (staying downstairs for now) Has following equipment at home: Single point cane and Walker - 2 wheeled  OCCUPATION: Therapist -- mostly desk work   PLOF: Independent  PATIENT GOALS: Return to work and normal activities  NEXT MD VISIT:   OBJECTIVE:  Note: Objective measures were completed at Evaluation unless otherwise noted.  DIAGNOSTIC FINDINGS: nothing new in Epic  PATIENT SURVEYS:  LEFS 6/80 = 7.5%  SENSATION: A little numbness on the front  EDEMA:  Circumferential: did not assess as pt still has ACE wrap compression and was told by ortho to keep it until follow up today  MUSCLE LENGTH: Hamstrings: Left limited due to edema Thomas test: Did not assess  POSTURE: weight shift right and L knee flexed  PALPATION: TTP ant and posterior L thigh, ant knee  LOWER EXTREMITY ROM:  Active ROM Right eval Left eval 10/23/23  Hip flexion     Hip extension     Hip abduction      Hip adduction     Hip internal rotation     Hip external rotation     Knee flexion 115 AROM 75 AROM 80 PROM 83 AROM  Knee extension -5 AROM -40 AROM -25 PROM   Ankle dorsiflexion     Ankle plantarflexion     Ankle inversion     Ankle eversion      (Blank rows = not tested)  LOWER EXTREMITY MMT:  MMT Right eval Left eval  Hip flexion 5 4*  Hip extension    Hip abduction    Hip adduction    Hip internal rotation    Hip external rotation    Knee flexion 5 4  Knee extension 5 2+  Ankle dorsiflexion 5 5  Ankle plantarflexion    Ankle inversion    Ankle eversion     (Blank rows = not tested, * = pain)  LOWER EXTREMITY SPECIAL TESTS:  Did not assess  FUNCTIONAL TESTS:  5 times sit to stand: 15.79 sec DGI: 12/24     GAIT: Distance walked: Into clinic Assistive device utilized: Environmental consultant - 2 wheeled Level of assistance: Modified independence Comments: Antalgic, decreased L knee flexion during swing, decreased extension during stance, early step through pattern                                                                                                                                TREATMENT DATE:  10/25/23 Therapeutic Exercise: to improve strength, ROM, flexibility, and endurance  Nustep L3x35min UE/LE Seated LAQ 2x5 Seated heel slide x 10 Quad sets 10x5" Thomas stretch 1 min 2x Standing lumbar extension w/ walker x 10  Gait Training: With walker supervision 360 ft- cues for heel strike, increased knee flexion, increased hip extension, upright posture   Game Ready x 10 min L knee low compression 34 deg   10/23/23 Therapeutic exercise:  Supine for quad sets over 2 towel rolls, used towel roll for tactile stimulus to engage quads better 10 reps Supine for assisted SLR 10 reps Seated L knee flexion slides with foot on pillow case 10 reps Assisted long arc quads L , therapist providing assistance, then transitioned to pt performing with assist with black  theraband, 15 reps total cues to sit with thigh supported to avoid  utilizing L hip flexors  Therapeutic activity: Raised front of walker for better biomechanical positioning Instructed to continuously move walker, pt using step to pattern, encouraged to achieve toe off B to improve L knee extension mid stance as well as L hip extension Nustep. Level1, mid range for 3 min Ue's and LE's   10/21/23  See initial HEP below   PATIENT EDUCATION:  Education details: HEP update Person educated: Patient Education method: Explanation, Demonstration, and Handouts Education comprehension: verbalized understanding, returned demonstration, and needs further education  HOME EXERCISE PROGRAM: Access Code: [email or web address] URL: [email or web address] Date: 10/25/2023 Prepared by: Verta Ellen  Exercises - Supine Quad Set  - 1 x daily - 7 x weekly - 2 sets - 10 reps - 3 sec hold - Supine Knee Extension Strengthening  - 1 x daily - 7 x weekly - 2 sets - 10 reps - Hooklying Gluteal Sets  - 1 x daily - 7 x weekly - 2 sets - 10 reps - Bent Knee Fallouts  - 1 x daily - 7 x weekly - 2 sets - 10 reps - Modified Thomas Stretch  - 1 x daily - 7 x weekly - 3 sets - 3 reps - 1 min  hold - Standing Lumbar Extension  - 1 x daily - 7 x weekly - 3 sets - 3 reps  ASSESSMENT:  CLINICAL IMPRESSION: Pt responded well to the treatment today, although emotional at times d/t pain. Worked on improving her knee ROM but also hip mobility to improve her gait. She has a hard time isolating her quads with the QS. Many corrective cues required with gait training to facilitate correct biomechanics while using AD. She really enjoyed the thomas stretch and lumbar extension. Concluded session with GR to address swelling and pain in L knee.      From eval: Patient is a 48 y.o. F who was seen today for physical therapy evaluation and treatment for L TKR on 10/17/23. PMH significant for prior L knee surgery (did not have full  range) as well as L side sciatica. Assessment significant for decreased L knee ROM, poor quad activation, edema and L>R LE weakness leading to decreased balance, gait dysfunction, and transfers affecting home and community mobility. Pt will benefit from PT to address these deficits for return to her baseline function.   OBJECTIVE IMPAIRMENTS: Abnormal gait, decreased activity tolerance, decreased balance, decreased endurance, decreased mobility, difficulty walking, decreased ROM, decreased strength, hypomobility, increased edema, increased fascial restrictions, increased muscle spasms, impaired flexibility, impaired sensation, postural dysfunction, and pain.     GOALS: Goals reviewed with patient? Yes   SHORT TERM GOALS: Target date: 11/18/2023   Independent with initial HEP. Baseline: Newly issued on eval Goal status: INITIAL  2.  Pt will have improved L knee ROM from to 10-90 deg Baseline: 25-80 PROM, 40-75 AROM Goal status: INITIAL  3.  Pt will be able to wean to [facility] for home mobility utilizing reciprocal gait pattern Baseline: RW utilized with early step through pattern Goal status: INITIAL   LONG TERM GOALS: Target date: 12/16/2023  Independent with advanced/ongoing HEP to improve outcomes and carryover.  Baseline:  Goal status: INITIAL  2.  Janice Coffin will demonstrate L knee flexion to 110 deg to ascend/descend stairs. Baseline: 80 deg AROM Goal status: INITIAL  3.  Janice Coffin will demonstrate L knee extension = to R knee for safety with gait. Baseline: R knee = 5 deg from full ext, L knee = 25 deg from  full ext Goal status: INITIAL  4.  Flecia Shutter will be able to ambulate 1000' ind and normal gait pattern to access community/work.  Baseline: currently using RW Goal status: INITIAL  5.  Milly Goggins will be able to ascend/descend stairs with 1 HR and reciprocal step pattern safely to access home and community.  Baseline: not performing stairs Goal status:  INITIAL  6.  Arrabella Westerman will demonstrate > 19/24 on DGI to demonstrate decreased risk of falls.   Baseline: 12/24 Goal status: INITIAL  7.  Janice Coffin will demonstrate improved functional LE strength by completing 5x STS in <10 seconds.  (MCID 5 seconds) Baseline: 15.79 sec Goal status: INITIAL  8.  Chyrl Elwell will have improved LEFS to >/=15/80 to demo MCID Baseline: 6/80 Goal status: INITIAL   PLAN:  PT FREQUENCY: 2x/week  PT DURATION: 8 weeks  PLANNED INTERVENTIONS: 97164- PT Re-evaluation, 97110-Therapeutic exercises, 97530- Therapeutic activity, 97112- Neuromuscular re-education, 97535- Self Care, 16109- Manual therapy, [phone]- Gait training, [phone]- Aquatic Therapy, [phone]- Electrical stimulation (unattended), 97035- Ultrasound, 29562- Ionotophoresis 4mg /ml Dexamethasone, Patient/Family education, Balance training, Stair training, Taping, Dry Needling, Joint mobilization, Spinal mobilization, Cryotherapy, and Moist heat  PLAN FOR NEXT SESSION: Work on quad firing, knee ROM, and general L LE strengthening. Gait training   Darleene Cleaver, PTA 10/25/2023, 11:57 AM

## 2023-10-28 ENCOUNTER — Ambulatory Visit

## 2023-10-28 DIAGNOSIS — M6281 Muscle weakness (generalized): Secondary | ICD-10-CM

## 2023-10-28 DIAGNOSIS — M25662 Stiffness of left knee, not elsewhere classified: Secondary | ICD-10-CM

## 2023-10-28 DIAGNOSIS — G8929 Other chronic pain: Secondary | ICD-10-CM

## 2023-10-28 DIAGNOSIS — R262 Difficulty in walking, not elsewhere classified: Secondary | ICD-10-CM

## 2023-10-28 DIAGNOSIS — M25562 Pain in left knee: Secondary | ICD-10-CM | POA: Diagnosis not present

## 2023-10-28 NOTE — Therapy (Signed)
 OUTPATIENT PHYSICAL THERAPY LOWER EXTREMITY TREATMENT   Patient Name: Gabriela Bean MRN: [id number] DOB:1975-09-12, 48 y.o., female Today's Date: 10/28/2023  END OF SESSION:  PT End of Session - 10/28/23 1111     Visit Number 4    Number of Visits 16    Date for PT Re-Evaluation 12/16/23    Authorization Type [employer]    Progress Note Due on Visit 10    PT Start Time 1020    PT Stop Time 1115    PT Time Calculation (min) 55 min    Activity Tolerance Patient tolerated treatment well;Patient limited by pain    Behavior During Therapy [facility] for tasks assessed/performed                Past Medical History:  Diagnosis Date   ALLERGIC RHINITIS 01/06/2009   Depression    Family history of BRCA2 gene positive    Family history of breast cancer    Family history of ovarian cancer    Family history of prostate cancer    Infertility, female    VBAC, delivered, current hospitalization 09/01/2012   Past Surgical History:  Procedure Laterality Date   CESAREAN SECTION  03/25/10   KNEE SURGERY Left 2010   torn NCL   Patient Active Problem List   Diagnosis Date Noted   Prediabetes 10/09/2023   Anxiety disorder 04/11/2023   Primary osteoarthritis of both first carpometacarpal joints 08/05/2019   Genetic testing 01/27/2019   Family history of breast cancer    Family history of ovarian cancer    Family history of prostate cancer    Family history of BRCA2 gene positive    Pain in left knee 03/20/2018   ROM (rupture of membranes), premature 09/01/2012   Acute sinus infection 08/30/2012   Dyspnea and respiratory abnormality 08/30/2012   Allergic rhinitis 01/06/2009    PCP: Salvatore Decent, FNP  REFERRING PROVIDER: Teryl Lucy, MD  REFERRING DIAG: [phone] (ICD-10-CM) - S/P total knee replacement  THERAPY DIAG:  Chronic pain of left knee  Stiffness of left knee, not elsewhere classified  Muscle weakness (generalized)  Difficulty in walking, not elsewhere  classified  Rationale for Evaluation and Treatment: Rehabilitation  ONSET DATE: 10/17/23 L TKA  SUBJECTIVE:   SUBJECTIVE STATEMENT:   Pt reports that her R knee is a 3/10 right now, sciatic pain is 8/10.  Eval: Pt reports a lot of swelling and she had a lot of bleeding. Has been icing and getting up every hour. Pt was told not to do all of the take home exercises. Will be seeing ortho this afternoon.   PERTINENT HISTORY: L sciatica PAIN:  Are you having pain? Yes: NPRS scale: 8/10 currently Pain location: anterior knee and back of thigh Pain description: dull aching in thigh, sharpness in knee Aggravating factors: Walking, transfers Relieving factors: ice, pain medicine  PRECAUTIONS: None  RED FLAGS: None   WEIGHT BEARING RESTRICTIONS: No  FALLS:  Has patient fallen in last 6 months? No  LIVING ENVIRONMENT: Lives with: lives with their family son and daughter Lives in: House/apartment Stairs: Yes: Internal: 13 steps; on left going up and External: 3 steps; on right going up (staying downstairs for now) Has following equipment at home: Single point cane and Walker - 2 wheeled  OCCUPATION: Therapist -- mostly desk work   PLOF: Independent  PATIENT GOALS: Return to work and normal activities  NEXT MD VISIT:   OBJECTIVE:  Note: Objective measures were completed at Evaluation unless otherwise noted.  DIAGNOSTIC FINDINGS: nothing new in Epic  PATIENT SURVEYS:  LEFS 6/80 = 7.5%  SENSATION: A little numbness on the front  EDEMA:  Circumferential: did not assess as pt still has ACE wrap compression and was told by ortho to keep it until follow up today  MUSCLE LENGTH: Hamstrings: Left limited due to edema Thomas test: Did not assess  POSTURE: weight shift right and L knee flexed  PALPATION: TTP ant and posterior L thigh, ant knee  LOWER EXTREMITY ROM:  Active ROM Right eval Left eval 10/23/23  Hip flexion     Hip extension     Hip abduction     Hip  adduction     Hip internal rotation     Hip external rotation     Knee flexion 115 AROM 75 AROM 80 PROM 83 AROM  Knee extension -5 AROM -40 AROM -25 PROM   Ankle dorsiflexion     Ankle plantarflexion     Ankle inversion     Ankle eversion      (Blank rows = not tested)  LOWER EXTREMITY MMT:  MMT Right eval Left eval  Hip flexion 5 4*  Hip extension    Hip abduction    Hip adduction    Hip internal rotation    Hip external rotation    Knee flexion 5 4  Knee extension 5 2+  Ankle dorsiflexion 5 5  Ankle plantarflexion    Ankle inversion    Ankle eversion     (Blank rows = not tested, * = pain)  LOWER EXTREMITY SPECIAL TESTS:  Did not assess  FUNCTIONAL TESTS:  5 times sit to stand: 15.79 sec DGI: 12/24     GAIT: Distance walked: Into clinic Assistive device utilized: Environmental consultant - 2 wheeled Level of assistance: Modified independence Comments: Antalgic, decreased L knee flexion during swing, decreased extension during stance, early step through pattern                                                                                                                                TREATMENT DATE:  10/25/23 Therapeutic Exercise: to improve strength, ROM, flexibility, and endurance  Seated heel slides AROM x 10; AAROM with strap x 10 Seated L LAQ x 10 Seated hamstring stretch with strap 2x30" Supine QS with towel under knee x 20 Supine L SLR x 10- decreased TKE but no quad lag Standing heel raises 2x10 Standing L hip extension 2x10  Rec bike x 3 min partial rev GAIT TRAINING: To normalize gait pattern and improve safety. SPC supervision- L hip circumduction on swing phase otherwise stable with cane 180 ft Game Ready x 10 min L knee low compression 34 deg   10/25/23 Therapeutic Exercise: to improve strength, ROM, flexibility, and endurance  Nustep L3x48min UE/LE Seated LAQ 2x5 Seated heel slide x 10 Quad sets 10x5" Thomas stretch 1 min 2x Standing lumbar  extension w/ walker x 10  Gait  Training: With walker supervision 360 ft- cues for heel strike, increased knee flexion, increased hip extension, upright posture   Game Ready x 10 min L knee low compression 34 deg   10/23/23 Therapeutic exercise:  Supine for quad sets over 2 towel rolls, used towel roll for tactile stimulus to engage quads better 10 reps Supine for assisted SLR 10 reps Seated L knee flexion slides with foot on pillow case 10 reps Assisted long arc quads L , therapist providing assistance, then transitioned to pt performing with assist with black theraband, 15 reps total cues to sit with thigh supported to avoid utilizing L hip flexors  Therapeutic activity: Raised front of walker for better biomechanical positioning Instructed to continuously move walker, pt using step to pattern, encouraged to achieve toe off B to improve L knee extension mid stance as well as L hip extension Nustep. Level1, mid range for 3 min Ue's and LE's   10/21/23  See initial HEP below   PATIENT EDUCATION:  Education details: HEP update Person educated: Patient Education method: Explanation, Demonstration, and Handouts Education comprehension: verbalized understanding, returned demonstration, and needs further education  HOME EXERCISE PROGRAM: Access Code: [email or web address] URL: [email or web address] Date: 10/25/2023 Prepared by: Dewaine Morocho  Exercises - Supine Quad Set  - 1 x daily - 7 x weekly - 2 sets - 10 reps - 3 sec hold - Supine Knee Extension Strengthening  - 1 x daily - 7 x weekly - 2 sets - 10 reps - Hooklying Gluteal Sets  - 1 x daily - 7 x weekly - 2 sets - 10 reps - Bent Knee Fallouts  - 1 x daily - 7 x weekly - 2 sets - 10 reps - Modified Thomas Stretch  - 1 x daily - 7 x weekly - 3 sets - 3 reps - 1 min  hold - Standing Lumbar Extension  - 1 x daily - 7 x weekly - 3 sets - 3 reps  ASSESSMENT:  CLINICAL IMPRESSION: Pt responded well to the treatment today. Worked  on improving her knee ROM especially extension. She still has a hard time isolating her quads with the QS. Many corrective cues required with gait training to facilitate correct biomechanics while using AD, although she is stable with cane now. Concluded session with GR to address swelling and pain in L knee.      From eval: Patient is a 48 y.o. F who was seen today for physical therapy evaluation and treatment for L TKR on 10/17/23. PMH significant for prior L knee surgery (did not have full range) as well as L side sciatica. Assessment significant for decreased L knee ROM, poor quad activation, edema and L>R LE weakness leading to decreased balance, gait dysfunction, and transfers affecting home and community mobility. Pt will benefit from PT to address these deficits for return to her baseline function.   OBJECTIVE IMPAIRMENTS: Abnormal gait, decreased activity tolerance, decreased balance, decreased endurance, decreased mobility, difficulty walking, decreased ROM, decreased strength, hypomobility, increased edema, increased fascial restrictions, increased muscle spasms, impaired flexibility, impaired sensation, postural dysfunction, and pain.     GOALS: Goals reviewed with patient? Yes   SHORT TERM GOALS: Target date: 11/18/2023   Independent with initial HEP. Baseline: Newly issued on eval Goal status: MET- 10/28/23  2.  Pt will have improved L knee ROM from to 10-90 deg Baseline: 25-80 PROM, 40-75 AROM Goal status: INITIAL  3.  Pt will be able to wean to [facility] for home mobility utilizing reciprocal  gait pattern Baseline: RW utilized with early step through pattern Goal status: INITIAL   LONG TERM GOALS: Target date: 12/16/2023  Independent with advanced/ongoing HEP to improve outcomes and carryover.  Baseline:  Goal status: INITIAL  2.  Dewayne Ford will demonstrate L knee flexion to 110 deg to ascend/descend stairs. Baseline: 80 deg AROM Goal status: INITIAL  3.  Dewayne Ford  will demonstrate L knee extension = to R knee for safety with gait. Baseline: R knee = 5 deg from full ext, L knee = 25 deg from full ext Goal status: INITIAL  4.  Kaliya Shreiner will be able to ambulate 1000' ind and normal gait pattern to access community/work.  Baseline: currently using RW Goal status: INITIAL  5.  Georgia Delsignore will be able to ascend/descend stairs with 1 HR and reciprocal step pattern safely to access home and community.  Baseline: not performing stairs Goal status: INITIAL  6.  Ramia Sidney will demonstrate > 19/24 on DGI to demonstrate decreased risk of falls.   Baseline: 12/24 Goal status: INITIAL  7.  Dewayne Ford will demonstrate improved functional LE strength by completing 5x STS in <10 seconds.  (MCID 5 seconds) Baseline: 15.79 sec Goal status: INITIAL  8.  Cherylann Hobday will have improved LEFS to >/=15/80 to demo MCID Baseline: 6/80 Goal status: INITIAL   PLAN:  PT FREQUENCY: 2x/week  PT DURATION: 8 weeks  PLANNED INTERVENTIONS: 97164- PT Re-evaluation, 97110-Therapeutic exercises, 97530- Therapeutic activity, 97112- Neuromuscular re-education, 97535- Self Care, 40981- Manual therapy, [phone]- Gait training, [phone]- Aquatic Therapy, [phone]- Electrical stimulation (unattended), 97035- Ultrasound, 65784- Ionotophoresis 4mg /ml Dexamethasone, Patient/Family education, Balance training, Stair training, Taping, Dry Needling, Joint mobilization, Spinal mobilization, Cryotherapy, and Moist heat  PLAN FOR NEXT SESSION: Work on quad firing, knee ROM, and general L LE strengthening. Gait training   Jsiah Menta L Antia Rahal, PTA 10/28/2023, 11:22 AM

## 2023-10-30 ENCOUNTER — Ambulatory Visit: Admitting: Physical Therapy

## 2023-10-30 DIAGNOSIS — M25562 Pain in left knee: Secondary | ICD-10-CM | POA: Diagnosis not present

## 2023-10-30 DIAGNOSIS — R262 Difficulty in walking, not elsewhere classified: Secondary | ICD-10-CM

## 2023-10-30 DIAGNOSIS — G8929 Other chronic pain: Secondary | ICD-10-CM

## 2023-10-30 DIAGNOSIS — M6281 Muscle weakness (generalized): Secondary | ICD-10-CM

## 2023-10-30 DIAGNOSIS — M25662 Stiffness of left knee, not elsewhere classified: Secondary | ICD-10-CM

## 2023-10-30 NOTE — Therapy (Signed)
 OUTPATIENT PHYSICAL THERAPY LOWER EXTREMITY TREATMENT   Patient Name: Gabriela Bean MRN: [id number] DOB:02/28/1976, 48 y.o., female Today's Date: 10/30/2023  END OF SESSION:  PT End of Session - 10/30/23 1019     Visit Number 5    Number of Visits 16    Date for PT Re-Evaluation 12/16/23    Authorization Type [employer]    Progress Note Due on Visit 10    PT Start Time 1019    PT Stop Time 1100    PT Time Calculation (min) 41 min    Activity Tolerance Patient tolerated treatment well;Patient limited by pain    Behavior During Therapy WFL for tasks assessed/performed                Past Medical History:  Diagnosis Date   ALLERGIC RHINITIS 01/06/2009   Depression    Family history of BRCA2 gene positive    Family history of breast cancer    Family history of ovarian cancer    Family history of prostate cancer    Infertility, female    VBAC, delivered, current hospitalization 09/01/2012   Past Surgical History:  Procedure Laterality Date   CESAREAN SECTION  03/25/10   KNEE SURGERY Left 2010   torn NCL   Patient Active Problem List   Diagnosis Date Noted   Prediabetes 10/09/2023   Anxiety disorder 04/11/2023   Primary osteoarthritis of both first carpometacarpal joints 08/05/2019   Genetic testing 01/27/2019   Family history of breast cancer    Family history of ovarian cancer    Family history of prostate cancer    Family history of BRCA2 gene positive    Pain in left knee 03/20/2018   ROM (rupture of membranes), premature 09/01/2012   Acute sinus infection 08/30/2012   Dyspnea and respiratory abnormality 08/30/2012   Allergic rhinitis 01/06/2009    PCP: Gavin Kast, FNP  REFERRING PROVIDER: Osa Blase, MD  REFERRING DIAG: [phone] (ICD-10-CM) - S/P total knee replacement  THERAPY DIAG:  Chronic pain of left knee  Stiffness of left knee, not elsewhere classified  Muscle weakness (generalized)  Difficulty in walking, not elsewhere  classified  Rationale for Evaluation and Treatment: Rehabilitation  ONSET DATE: 10/17/23 L TKA  SUBJECTIVE:   SUBJECTIVE STATEMENT:   Pt states she didn't sleep well last night. Got a lot of sharp pain this morning. Feeling it most in anterior/medial knee today. States she has not been good at using her compression socks. Able to walk without the cane at home. Will be seeing ortho today for her 2 weeks post op.   Eval: Pt reports a lot of swelling and she had a lot of bleeding. Has been icing and getting up every hour. Pt was told not to do all of the take home exercises. Will be seeing ortho this afternoon.   PERTINENT HISTORY: L sciatica PAIN:  Are you having pain? Yes: NPRS scale: 9/10 currently Pain location: anterior knee and back of thigh Pain description: dull aching in thigh, sharpness in knee Aggravating factors: Walking, transfers Relieving factors: ice, pain medicine  PRECAUTIONS: None  RED FLAGS: None   WEIGHT BEARING RESTRICTIONS: No  FALLS:  Has patient fallen in last 6 months? No  LIVING ENVIRONMENT: Lives with: lives with their family son and daughter Lives in: House/apartment Stairs: Yes: Internal: 13 steps; on left going up and External: 3 steps; on right going up (staying downstairs for now) Has following equipment at home: Single point cane and Walker - 2 wheeled  OCCUPATION: Therapist -- mostly desk work   PLOF: Independent  PATIENT GOALS: Return to work and normal activities  NEXT MD VISIT:   OBJECTIVE:  Note: Objective measures were completed at Evaluation unless otherwise noted.  DIAGNOSTIC FINDINGS: nothing new in Epic  PATIENT SURVEYS:  LEFS 6/80 = 7.5%  SENSATION: A little numbness on the front  EDEMA:  Circumferential: did not assess as pt still has ACE wrap compression and was told by ortho to keep it until follow up today  MUSCLE LENGTH: Hamstrings: Left limited due to edema Thomas test: Did not assess  POSTURE: weight shift  right and L knee flexed  PALPATION: TTP ant and posterior L thigh, ant knee  LOWER EXTREMITY ROM:  Active ROM Right eval Left eval 10/23/23  Hip flexion     Hip extension     Hip abduction     Hip adduction     Hip internal rotation     Hip external rotation     Knee flexion 115 AROM 75 AROM 80 PROM 83 AROM  Knee extension -5 AROM -40 AROM -25 PROM   Ankle dorsiflexion     Ankle plantarflexion     Ankle inversion     Ankle eversion      (Blank rows = not tested)  LOWER EXTREMITY MMT:  MMT Right eval Left eval  Hip flexion 5 4*  Hip extension    Hip abduction    Hip adduction    Hip internal rotation    Hip external rotation    Knee flexion 5 4  Knee extension 5 2+  Ankle dorsiflexion 5 5  Ankle plantarflexion    Ankle inversion    Ankle eversion     (Blank rows = not tested, * = pain)  LOWER EXTREMITY SPECIAL TESTS:  Did not assess  FUNCTIONAL TESTS:  5 times sit to stand: 15.79 sec DGI: 12/24     GAIT: Distance walked: Into clinic Assistive device utilized: Environmental consultant - 2 wheeled Level of assistance: Modified independence Comments: Antalgic, decreased L knee flexion during swing, decreased extension during stance, early step through pattern                                                                                                                                TREATMENT DATE:  10/30/23 Therapeutic Exercise: Nustep L5 x 6 min Ues/LEs Seated hamstring stretch 2x30" Seated gastroc stretch with strap x30" Seated adductor stretch 2x30" Supine clamshell red TB 2x10 Supine knee flexion self stretch walking feet  Towel under foot, quad set x10 (15 deg from full ext)  Neuro Re-ed: Supine SAQ + ball squeeze 2x10 Supine SAQ + hip abd against red TB 2x10 Prolonged knee ext with foot on rolled towel 2x1 min SLR with strap assist 2x10 Standing L<>R weight shift with quad set   Manual Therapy: Grade II tibiofemoral joint mobs for knee flexion  Vaso:   Low compression, 10 min,  34 deg around R knee   10/25/23 Therapeutic Exercise: to improve strength, ROM, flexibility, and endurance  Seated heel slides AROM x 10; AAROM with strap x 10 Seated L LAQ x 10 Seated hamstring stretch with strap 2x30" Supine QS with towel under knee x 20 Supine L SLR x 10- decreased TKE but no quad lag Standing heel raises 2x10 Standing L hip extension 2x10  Rec bike x 3 min partial rev GAIT TRAINING: To normalize gait pattern and improve safety. SPC supervision- L hip circumduction on swing phase otherwise stable with cane 180 ft Game Ready x 10 min L knee low compression 34 deg   10/25/23 Therapeutic Exercise: to improve strength, ROM, flexibility, and endurance  Nustep L3x57min UE/LE Seated LAQ 2x5 Seated heel slide x 10 Quad sets 10x5" Thomas stretch 1 min 2x Standing lumbar extension w/ walker x 10  Gait Training: With walker supervision 360 ft- cues for heel strike, increased knee flexion, increased hip extension, upright posture   Game Ready x 10 min L knee low compression 34 deg   10/23/23 Therapeutic exercise:  Supine for quad sets over 2 towel rolls, used towel roll for tactile stimulus to engage quads better 10 reps Supine for assisted SLR 10 reps Seated L knee flexion slides with foot on pillow case 10 reps Assisted long arc quads L , therapist providing assistance, then transitioned to pt performing with assist with black theraband, 15 reps total cues to sit with thigh supported to avoid utilizing L hip flexors  Therapeutic activity: Raised front of walker for better biomechanical positioning Instructed to continuously move walker, pt using step to pattern, encouraged to achieve toe off B to improve L knee extension mid stance as well as L hip extension Nustep. Level1, mid range for 3 min Ue's and LE's   10/21/23  See initial HEP below   PATIENT EDUCATION:  Education details: HEP update Person educated: Patient Education method:  Explanation, Demonstration, and Handouts Education comprehension: verbalized understanding, returned demonstration, and needs further education  HOME EXERCISE PROGRAM: Access Code: [email or web address] URL: [email or web address] Date: 10/25/2023 Prepared by: Verta Ellen  Exercises - Supine Quad Set  - 1 x daily - 7 x weekly - 2 sets - 10 reps - 3 sec hold - Supine Knee Extension Strengthening  - 1 x daily - 7 x weekly - 2 sets - 10 reps - Hooklying Gluteal Sets  - 1 x daily - 7 x weekly - 2 sets - 10 reps - Bent Knee Fallouts  - 1 x daily - 7 x weekly - 2 sets - 10 reps - Modified Thomas Stretch  - 1 x daily - 7 x weekly - 3 sets - 3 reps - 1 min  hold - Standing Lumbar Extension  - 1 x daily - 7 x weekly - 3 sets - 3 reps  ASSESSMENT:  CLINICAL IMPRESSION: Pt with continued progressive increase in her ROM. Able to obtain 15-88 deg today in supine. Increasing quad activation noted. Less hip circumduction with gait. Has been feeling increased pain in medial/lateral knee likely from hip weakness. Added glute and adductor strengthening today with her quad strengthening.    From eval: Patient is a 48 y.o. F who was seen today for physical therapy evaluation and treatment for L TKR on 10/17/23. PMH significant for prior L knee surgery (did not have full range) as well as L side sciatica. Assessment significant for decreased L knee ROM, poor quad activation, edema and L>R LE weakness leading  to decreased balance, gait dysfunction, and transfers affecting home and community mobility. Pt will benefit from PT to address these deficits for return to her baseline function.   OBJECTIVE IMPAIRMENTS: Abnormal gait, decreased activity tolerance, decreased balance, decreased endurance, decreased mobility, difficulty walking, decreased ROM, decreased strength, hypomobility, increased edema, increased fascial restrictions, increased muscle spasms, impaired flexibility, impaired sensation, postural  dysfunction, and pain.     GOALS: Goals reviewed with patient? Yes   SHORT TERM GOALS: Target date: 11/18/2023   Independent with initial HEP. Baseline: Newly issued on eval Goal status: MET- 10/28/23  2.  Pt will have improved L knee ROM from to 10-90 deg Baseline: 25-80 PROM, 40-75 AROM Goal status: INITIAL  3.  Pt will be able to wean to [facility] for home mobility utilizing reciprocal gait pattern Baseline: RW utilized with early step through pattern Goal status: INITIAL   LONG TERM GOALS: Target date: 12/16/2023  Independent with advanced/ongoing HEP to improve outcomes and carryover.  Baseline:  Goal status: INITIAL  2.  Dewayne Ford will demonstrate L knee flexion to 110 deg to ascend/descend stairs. Baseline: 80 deg AROM Goal status: INITIAL  3.  Dewayne Ford will demonstrate L knee extension = to R knee for safety with gait. Baseline: R knee = 5 deg from full ext, L knee = 25 deg from full ext Goal status: INITIAL  4.  Alveria Mcglaughlin will be able to ambulate 1000' ind and normal gait pattern to access community/work.  Baseline: currently using RW Goal status: INITIAL  5.  Bernarda Erck will be able to ascend/descend stairs with 1 HR and reciprocal step pattern safely to access home and community.  Baseline: not performing stairs Goal status: INITIAL  6.  Tiffny Gemmer will demonstrate > 19/24 on DGI to demonstrate decreased risk of falls.   Baseline: 12/24 Goal status: INITIAL  7.  Dewayne Ford will demonstrate improved functional LE strength by completing 5x STS in <10 seconds.  (MCID 5 seconds) Baseline: 15.79 sec Goal status: INITIAL  8.  Dustin Burrill will have improved LEFS to >/=15/80 to demo MCID Baseline: 6/80 Goal status: INITIAL   PLAN:  PT FREQUENCY: 2x/week  PT DURATION: 8 weeks  PLANNED INTERVENTIONS: 97164- PT Re-evaluation, 97110-Therapeutic exercises, 97530- Therapeutic activity, 97112- Neuromuscular re-education, 97535- Self Care,  97140- Manual therapy, [phone]- Gait training, 60454- Aquatic Therapy, [phone]- Electrical stimulation (unattended), 97035- Ultrasound, 91478- Ionotophoresis 4mg /ml Dexamethasone, Patient/Family education, Balance training, Stair training, Taping, Dry Needling, Joint mobilization, Spinal mobilization, Cryotherapy, and Moist heat  PLAN FOR NEXT SESSION: Work on quad firing, knee ROM, and general L LE strengthening. Gait training   Elvena Oyer April Ma L Ellyse Rotolo, PT 10/30/2023, 10:19 AM

## 2023-11-04 ENCOUNTER — Ambulatory Visit: Admitting: Physical Therapy

## 2023-11-04 DIAGNOSIS — M25562 Pain in left knee: Secondary | ICD-10-CM | POA: Diagnosis not present

## 2023-11-04 DIAGNOSIS — M6281 Muscle weakness (generalized): Secondary | ICD-10-CM

## 2023-11-04 DIAGNOSIS — G8929 Other chronic pain: Secondary | ICD-10-CM

## 2023-11-04 DIAGNOSIS — M25662 Stiffness of left knee, not elsewhere classified: Secondary | ICD-10-CM

## 2023-11-04 DIAGNOSIS — R262 Difficulty in walking, not elsewhere classified: Secondary | ICD-10-CM

## 2023-11-04 NOTE — Therapy (Signed)
 OUTPATIENT PHYSICAL THERAPY LOWER EXTREMITY TREATMENT   Patient Name: Gabriela Bean MRN: [id number] DOB:Jul 31, 1975, 48 y.o., female Today's Date: 11/04/2023  END OF SESSION:  PT End of Session - 11/04/23 1019     Visit Number 6    Number of Visits 16    Date for PT Re-Evaluation 12/16/23    Authorization Type [employer]    Progress Note Due on Visit 10    PT Start Time 1019    PT Stop Time 1100    PT Time Calculation (min) 41 min    Activity Tolerance Patient tolerated treatment well;Patient limited by pain    Behavior During Therapy WFL for tasks assessed/performed             Past Medical History:  Diagnosis Date   ALLERGIC RHINITIS 01/06/2009   Depression    Family history of BRCA2 gene positive    Family history of breast cancer    Family history of ovarian cancer    Family history of prostate cancer    Infertility, female    VBAC, delivered, current hospitalization 09/01/2012   Past Surgical History:  Procedure Laterality Date   CESAREAN SECTION  03/25/10   KNEE SURGERY Left 2010   torn NCL   Patient Active Problem List   Diagnosis Date Noted   Prediabetes 10/09/2023   Anxiety disorder 04/11/2023   Primary osteoarthritis of both first carpometacarpal joints 08/05/2019   Genetic testing 01/27/2019   Family history of breast cancer    Family history of ovarian cancer    Family history of prostate cancer    Family history of BRCA2 gene positive    Pain in left knee 03/20/2018   ROM (rupture of membranes), premature 09/01/2012   Acute sinus infection 08/30/2012   Dyspnea and respiratory abnormality 08/30/2012   Allergic rhinitis 01/06/2009    PCP: Gavin Kast, FNP  REFERRING PROVIDER: Osa Blase, MD  REFERRING DIAG: [phone] (ICD-10-CM) - S/P total knee replacement  THERAPY DIAG:  Chronic pain of left knee  Stiffness of left knee, not elsewhere classified  Muscle weakness (generalized)  Difficulty in walking, not elsewhere classified  Rationale  for Evaluation and Treatment: Rehabilitation  ONSET DATE: 10/17/23 L TKA  SUBJECTIVE:   SUBJECTIVE STATEMENT:   Pt states this is her first day back at work. Not using any pain medicine today. Did some yard work this weekend so feels a little sore.   Eval: Pt reports a lot of swelling and she had a lot of bleeding. Has been icing and getting up every hour. Pt was told not to do all of the take home exercises. Will be seeing ortho this afternoon.   PERTINENT HISTORY: L sciatica PAIN:  Are you having pain? Yes: NPRS scale: 8/10 currently Pain location: anterior knee and back of thigh Pain description: dull aching in thigh, sharpness in knee Aggravating factors: Walking, transfers Relieving factors: ice, pain medicine  PRECAUTIONS: None  RED FLAGS: None   WEIGHT BEARING RESTRICTIONS: No  FALLS:  Has patient fallen in last 6 months? No  LIVING ENVIRONMENT: Lives with: lives with their family son and daughter Lives in: House/apartment Stairs: Yes: Internal: 13 steps; on left going up and External: 3 steps; on right going up (staying downstairs for now) Has following equipment at home: Single point cane and Walker - 2 wheeled  OCCUPATION: Therapist -- mostly desk work   PLOF: Independent  PATIENT GOALS: Return to work and normal activities  NEXT MD VISIT:   OBJECTIVE:  Note:  Objective measures were completed at Evaluation unless otherwise noted.  DIAGNOSTIC FINDINGS: nothing new in Epic  PATIENT SURVEYS:  LEFS 6/80 = 7.5%  SENSATION: A little numbness on the front  EDEMA:  Circumferential: did not assess as pt still has ACE wrap compression and was told by ortho to keep it until follow up today  MUSCLE LENGTH: Hamstrings: Left limited due to edema Thomas test: Did not assess  POSTURE: weight shift right and L knee flexed  PALPATION: TTP ant and posterior L thigh, ant knee  LOWER EXTREMITY ROM:  Active ROM Right eval Left eval 10/23/23  Hip flexion      Hip extension     Hip abduction     Hip adduction     Hip internal rotation     Hip external rotation     Knee flexion 115 AROM 75 AROM 80 PROM 83 AROM  Knee extension -5 AROM -40 AROM -25 PROM   Ankle dorsiflexion     Ankle plantarflexion     Ankle inversion     Ankle eversion      (Blank rows = not tested)  LOWER EXTREMITY MMT:  MMT Right eval Left eval  Hip flexion 5 4*  Hip extension    Hip abduction    Hip adduction    Hip internal rotation    Hip external rotation    Knee flexion 5 4  Knee extension 5 2+  Ankle dorsiflexion 5 5  Ankle plantarflexion    Ankle inversion    Ankle eversion     (Blank rows = not tested, * = pain)  LOWER EXTREMITY SPECIAL TESTS:  Did not assess  FUNCTIONAL TESTS:  5 times sit to stand: 15.79 sec DGI: 12/24     GAIT: Distance walked: Into clinic Assistive device utilized: Environmental consultant - 2 wheeled Level of assistance: Modified independence Comments: Antalgic, decreased L knee flexion during swing, decreased extension during stance, early step through pattern                                                                                                                                TREATMENT DATE:  11/04/23 Supine feet on ball, knee flex/ext x20 Supine feet on ball, hamstring setting with knees flexed 2x10x3" 90/90 knees & hips x2 min for knee flexion Grade II to III tibiofemoral knee mob for flexion and then ext Patellar mobs all directions Walk feet back for knee flexion 3x10" (up to 95 deg) Supine feet on ball, knee ext glute set x10 Supine feet on ball, knee ext bridge x10 Supine hamstring stretch with strap x30" Knee ext stretch x2 min Supine hip flexor stretch off EOB 2x30" Hip/calf stretch 2x30" Terminal knee ext red TB 2x10 Sit<>stand 2x10  Amb with and without cane x 2 laps Game Ready x 10 min L knee low compression 34 deg    10/30/23 Therapeutic Exercise: Nustep L5 x 6 min Ues/LEs Seated hamstring stretch  2x30" Seated gastroc stretch with strap x30" Seated adductor stretch 2x30" Supine clamshell red TB 2x10 Supine knee flexion self stretch walking feet  Towel under foot, quad set x10 (15 deg from full ext)  Neuro Re-ed: Supine SAQ + ball squeeze 2x10 Supine SAQ + hip abd against red TB 2x10 Prolonged knee ext with foot on rolled towel 2x1 min SLR with strap assist 2x10 Standing L<>R weight shift with quad set   Manual Therapy: Grade II tibiofemoral joint mobs for knee flexion  Vaso:  Low compression, 10 min, 34 deg around R knee   10/25/23 Therapeutic Exercise: to improve strength, ROM, flexibility, and endurance  Seated heel slides AROM x 10; AAROM with strap x 10 Seated L LAQ x 10 Seated hamstring stretch with strap 2x30" Supine QS with towel under knee x 20 Supine L SLR x 10- decreased TKE but no quad lag Standing heel raises 2x10 Standing L hip extension 2x10  Rec bike x 3 min partial rev GAIT TRAINING: To normalize gait pattern and improve safety. SPC supervision- L hip circumduction on swing phase otherwise stable with cane 180 ft Game Ready x 10 min L knee low compression 34 deg   10/25/23 Therapeutic Exercise: to improve strength, ROM, flexibility, and endurance  Nustep L3x28min UE/LE Seated LAQ 2x5 Seated heel slide x 10 Quad sets 10x5" Thomas stretch 1 min 2x Standing lumbar extension w/ walker x 10  Gait Training: With walker supervision 360 ft- cues for heel strike, increased knee flexion, increased hip extension, upright posture   Game Ready x 10 min L knee low compression 34 deg   10/23/23 Therapeutic exercise:  Supine for quad sets over 2 towel rolls, used towel roll for tactile stimulus to engage quads better 10 reps Supine for assisted SLR 10 reps Seated L knee flexion slides with foot on pillow case 10 reps Assisted long arc quads L , therapist providing assistance, then transitioned to pt performing with assist with black theraband, 15 reps total  cues to sit with thigh supported to avoid utilizing L hip flexors  Therapeutic activity: Raised front of walker for better biomechanical positioning Instructed to continuously move walker, pt using step to pattern, encouraged to achieve toe off B to improve L knee extension mid stance as well as L hip extension Nustep. Level1, mid range for 3 min Ue's and LE's   10/21/23  See initial HEP below   PATIENT EDUCATION:  Education details: HEP update Person educated: Patient Education method: Explanation, Demonstration, and Handouts Education comprehension: verbalized understanding, returned demonstration, and needs further education  HOME EXERCISE PROGRAM: Access Code: [email or web address] URL: [email or web address] Date: 11/04/2023 Prepared by: Rasheed Welty April Marie Mattisyn Cardona  Exercises - Modified Thomas Stretch  - 1 x daily - 7 x weekly - 3 sets - 3 reps - 1 min  hold - Standing Lumbar Extension  - 1 x daily - 7 x weekly - 3 sets - 3 reps - Seated Hamstring Stretch with Strap  - 1 x daily - 7 x weekly - 2 sets - 2 reps - 30 sec hold - Heel Raises with Counter Support  - 1 x daily - 7 x weekly - 2 sets - 10 reps - Hooklying Isometric Clamshell  - 1 x daily - 7 x weekly - 2 sets - 10 reps - Gastroc Stretch on Wall  - 1 x daily - 7 x weekly - 2 sets - 30 sec hold - Standing Terminal Knee Extension with Resistance  - 1 x  daily - 7 x weekly - 2 sets - 10 reps - 3 sec hold - Supine Knee Extension Stretch on Towel Roll  - 1 x daily - 7 x weekly - 2 sets - 30 sec hold - Long Sitting Quad Set with Towel Roll Under Heel  - 1 x daily - 7 x weekly - 2 sets - 10 reps - 3 sec hold  ASSESSMENT:  CLINICAL IMPRESSION: Provided more joint mobs this session. Pt able to obtain >90 deg of flexion today. Extension remains most limited and painful. Working on improving terminal quad strength. Pt overall is improving in her knee ROM and strength.    From eval: Patient is a 48 y.o. F who was seen today for  physical therapy evaluation and treatment for L TKR on 10/17/23. PMH significant for prior L knee surgery (did not have full range) as well as L side sciatica. Assessment significant for decreased L knee ROM, poor quad activation, edema and L>R LE weakness leading to decreased balance, gait dysfunction, and transfers affecting home and community mobility. Pt will benefit from PT to address these deficits for return to her baseline function.   OBJECTIVE IMPAIRMENTS: Abnormal gait, decreased activity tolerance, decreased balance, decreased endurance, decreased mobility, difficulty walking, decreased ROM, decreased strength, hypomobility, increased edema, increased fascial restrictions, increased muscle spasms, impaired flexibility, impaired sensation, postural dysfunction, and pain.     GOALS: Goals reviewed with patient? Yes   SHORT TERM GOALS: Target date: 11/18/2023   Independent with initial HEP. Baseline: Newly issued on eval Goal status: MET- 10/28/23  2.  Pt will have improved L knee ROM from to 10-90 deg Baseline: 25-80 PROM, 40-75 AROM Goal status: INITIAL  3.  Pt will be able to wean to [facility] for home mobility utilizing reciprocal gait pattern Baseline: RW utilized with early step through pattern Goal status: INITIAL   LONG TERM GOALS: Target date: 12/16/2023  Independent with advanced/ongoing HEP to improve outcomes and carryover.  Baseline:  Goal status: INITIAL  2.  Dewayne Ford will demonstrate L knee flexion to 110 deg to ascend/descend stairs. Baseline: 80 deg AROM Goal status: INITIAL  3.  Dewayne Ford will demonstrate L knee extension = to R knee for safety with gait. Baseline: R knee = 5 deg from full ext, L knee = 25 deg from full ext Goal status: INITIAL  4.  Elaf Clauson will be able to ambulate 1000' ind and normal gait pattern to access community/work.  Baseline: currently using RW Goal status: INITIAL  5.  Gaia Gullikson will be able to ascend/descend stairs  with 1 HR and reciprocal step pattern safely to access home and community.  Baseline: not performing stairs Goal status: INITIAL  6.  Bria Sparr will demonstrate > 19/24 on DGI to demonstrate decreased risk of falls.   Baseline: 12/24 Goal status: INITIAL  7.  Dewayne Ford will demonstrate improved functional LE strength by completing 5x STS in <10 seconds.  (MCID 5 seconds) Baseline: 15.79 sec Goal status: INITIAL  8.  Wally Behan will have improved LEFS to >/=15/80 to demo MCID Baseline: 6/80 Goal status: INITIAL   PLAN:  PT FREQUENCY: 2x/week  PT DURATION: 8 weeks  PLANNED INTERVENTIONS: 97164- PT Re-evaluation, 97110-Therapeutic exercises, 97530- Therapeutic activity, 97112- Neuromuscular re-education, 97535- Self Care, 16109- Manual therapy, [phone]- Gait training, [phone]- Aquatic Therapy, [phone]- Electrical stimulation (unattended), 97035- Ultrasound, 29562- Ionotophoresis 4mg /ml Dexamethasone, Patient/Family education, Balance training, Stair training, Taping, Dry Needling, Joint mobilization, Spinal mobilization, Cryotherapy, and Moist  heat  PLAN FOR NEXT SESSION: Work on quad firing, knee ROM, and general L LE strengthening. Gait training   Gibbs Naugle April Ma L Rosemaria Inabinet, PT 11/04/2023, 10:19 AM

## 2023-11-07 ENCOUNTER — Ambulatory Visit

## 2023-11-07 DIAGNOSIS — M25562 Pain in left knee: Secondary | ICD-10-CM | POA: Diagnosis not present

## 2023-11-07 DIAGNOSIS — G8929 Other chronic pain: Secondary | ICD-10-CM

## 2023-11-07 DIAGNOSIS — M25662 Stiffness of left knee, not elsewhere classified: Secondary | ICD-10-CM

## 2023-11-07 DIAGNOSIS — M6281 Muscle weakness (generalized): Secondary | ICD-10-CM

## 2023-11-07 DIAGNOSIS — R262 Difficulty in walking, not elsewhere classified: Secondary | ICD-10-CM

## 2023-11-07 NOTE — Therapy (Signed)
 OUTPATIENT PHYSICAL THERAPY LOWER EXTREMITY TREATMENT   Patient Name: Gabriela Bean MRN: [id number] DOB:1976/03/19, 48 y.o., female Today's Date: 11/07/2023  END OF SESSION:  PT End of Session - 11/07/23 1025     Visit Number 7    Number of Visits 16    Date for PT Re-Evaluation 12/16/23    Authorization Type [employer]    Progress Note Due on Visit 10    PT Start Time 1017    PT Stop Time 1106    PT Time Calculation (min) 49 min    Activity Tolerance Patient tolerated treatment well    Behavior During Therapy WFL for tasks assessed/performed              Past Medical History:  Diagnosis Date   ALLERGIC RHINITIS 01/06/2009   Depression    Family history of BRCA2 gene positive    Family history of breast cancer    Family history of ovarian cancer    Family history of prostate cancer    Infertility, female    VBAC, delivered, current hospitalization 09/01/2012   Past Surgical History:  Procedure Laterality Date   CESAREAN SECTION  03/25/10   KNEE SURGERY Left 2010   torn NCL   Patient Active Problem List   Diagnosis Date Noted   Prediabetes 10/09/2023   Anxiety disorder 04/11/2023   Primary osteoarthritis of both first carpometacarpal joints 08/05/2019   Genetic testing 01/27/2019   Family history of breast cancer    Family history of ovarian cancer    Family history of prostate cancer    Family history of BRCA2 gene positive    Pain in left knee 03/20/2018   ROM (rupture of membranes), premature 09/01/2012   Acute sinus infection 08/30/2012   Dyspnea and respiratory abnormality 08/30/2012   Allergic rhinitis 01/06/2009    PCP: Gavin Kast, FNP  REFERRING PROVIDER: Osa Blase, MD  REFERRING DIAG: [id number] (ICD-10-CM) - S/P total knee replacement  THERAPY DIAG:  Chronic pain of left knee  Stiffness of left knee, not elsewhere classified  Muscle weakness (generalized)  Difficulty in walking, not elsewhere classified  Rationale for Evaluation and  Treatment: Rehabilitation  ONSET DATE: 10/17/23 L TKA  SUBJECTIVE:   SUBJECTIVE STATEMENT:   Pt reports she went back to work yesterday, ran out of tylenol  so a bit more pain today  Eval: Pt reports a lot of swelling and she had a lot of bleeding. Has been icing and getting up every hour. Pt was told not to do all of the take home exercises. Will be seeing ortho this afternoon.   PERTINENT HISTORY: L sciatica PAIN:  Are you having pain? Yes: NPRS scale: 6-7/10 currently Pain location: anterior knee and back of thigh Pain description: dull aching in thigh, sharpness in knee Aggravating factors: Walking, transfers Relieving factors: ice, pain medicine  PRECAUTIONS: None  RED FLAGS: None   WEIGHT BEARING RESTRICTIONS: No  FALLS:  Has patient fallen in last 6 months? No  LIVING ENVIRONMENT: Lives with: lives with their family son and daughter Lives in: House/apartment Stairs: Yes: Internal: 13 steps; on left going up and External: 3 steps; on right going up (staying downstairs for now) Has following equipment at home: Single point cane and Walker - 2 wheeled  OCCUPATION: Therapist -- mostly desk work   PLOF: Independent  PATIENT GOALS: Return to work and normal activities  NEXT MD VISIT:   OBJECTIVE:  Note: Objective measures were completed at Evaluation unless otherwise noted.  DIAGNOSTIC FINDINGS:  nothing new in Epic  PATIENT SURVEYS:  LEFS 6/80 = 7.5%  SENSATION: A little numbness on the front  EDEMA:  Circumferential: did not assess as pt still has ACE wrap compression and was told by ortho to keep it until follow up today  MUSCLE LENGTH: Hamstrings: Left limited due to edema Thomas test: Did not assess  POSTURE: weight shift right and L knee flexed  PALPATION: TTP ant and posterior L thigh, ant knee  LOWER EXTREMITY ROM:  Active ROM Right eval Left eval 10/23/23   Hip flexion      Hip extension      Hip abduction      Hip adduction      Hip  internal rotation      Hip external rotation      Knee flexion 115 AROM 75 AROM 80 PROM 83 AROM   Knee extension -5 AROM -40 AROM -25 PROM  6  Ankle dorsiflexion      Ankle plantarflexion      Ankle inversion      Ankle eversion       (Blank rows = not tested)  LOWER EXTREMITY MMT:  MMT Right eval Left eval  Hip flexion 5 4*  Hip extension    Hip abduction    Hip adduction    Hip internal rotation    Hip external rotation    Knee flexion 5 4  Knee extension 5 2+  Ankle dorsiflexion 5 5  Ankle plantarflexion    Ankle inversion    Ankle eversion     (Blank rows = not tested, * = pain)  LOWER EXTREMITY SPECIAL TESTS:  Did not assess  FUNCTIONAL TESTS:  5 times sit to stand: 15.79 sec DGI: 12/24     GAIT: Distance walked: Into clinic Assistive device utilized: Environmental consultant - 2 wheeled Level of assistance: Modified independence Comments: Antalgic, decreased L knee flexion during swing, decreased extension during stance, early step through pattern                                                                                                                                TREATMENT DATE:  11/07/23 Nustep L5x80min  Bike x 4 min partial revolutions seat 7  Prone TKE 10x5" Prone quad stretch with strap 2x30" Prone knee extension hang x 2 min Seated gastroc + HS stretch with strap 2x30" Tibia on femur mobs for extension grade III-IV Game Ready x 10 min L knee low compression 34 deg  11/04/23 Supine feet on ball, knee flex/ext x20 Supine feet on ball, hamstring setting with knees flexed 2x10x3" 90/90 knees & hips x2 min for knee flexion Grade II to III tibiofemoral knee mob for flexion and then ext Patellar mobs all directions Walk feet back for knee flexion 3x10" (up to 95 deg) Supine feet on ball, knee ext glute set x10 Supine feet on ball, knee ext bridge x10 Supine hamstring stretch with strap x30"  Knee ext stretch x2 min Supine hip flexor stretch off EOB  2x30" Hip/calf stretch 2x30" Terminal knee ext red TB 2x10 Sit<>stand 2x10  Amb with and without cane x 2 laps Game Ready x 10 min L knee low compression 34 deg    10/30/23 Therapeutic Exercise: Nustep L5 x 6 min Ues/LEs Seated hamstring stretch 2x30" Seated gastroc stretch with strap x30" Seated adductor stretch 2x30" Supine clamshell red TB 2x10 Supine knee flexion self stretch walking feet  Towel under foot, quad set x10 (15 deg from full ext)  Neuro Re-ed: Supine SAQ + ball squeeze 2x10 Supine SAQ + hip abd against red TB 2x10 Prolonged knee ext with foot on rolled towel 2x1 min SLR with strap assist 2x10 Standing L<>R weight shift with quad set   Manual Therapy: Grade II tibiofemoral joint mobs for knee flexion  Vaso:  Low compression, 10 min, 34 deg around R knee   10/25/23 Therapeutic Exercise: to improve strength, ROM, flexibility, and endurance  Seated heel slides AROM x 10; AAROM with strap x 10 Seated L LAQ x 10 Seated hamstring stretch with strap 2x30" Supine QS with towel under knee x 20 Supine L SLR x 10- decreased TKE but no quad lag Standing heel raises 2x10 Standing L hip extension 2x10  Rec bike x 3 min partial rev GAIT TRAINING: To normalize gait pattern and improve safety. SPC supervision- L hip circumduction on swing phase otherwise stable with cane 180 ft Game Ready x 10 min L knee low compression 34 deg   10/25/23 Therapeutic Exercise: to improve strength, ROM, flexibility, and endurance  Nustep L3x16min UE/LE Seated LAQ 2x5 Seated heel slide x 10 Quad sets 10x5" Thomas stretch 1 min 2x Standing lumbar extension w/ walker x 10  Gait Training: With walker supervision 360 ft- cues for heel strike, increased knee flexion, increased hip extension, upright posture   Game Ready x 10 min L knee low compression 34 deg   10/23/23 Therapeutic exercise:  Supine for quad sets over 2 towel rolls, used towel roll for tactile stimulus to engage quads  better 10 reps Supine for assisted SLR 10 reps Seated L knee flexion slides with foot on pillow case 10 reps Assisted long arc quads L , therapist providing assistance, then transitioned to pt performing with assist with black theraband, 15 reps total cues to sit with thigh supported to avoid utilizing L hip flexors  Therapeutic activity: Raised front of walker for better biomechanical positioning Instructed to continuously move walker, pt using step to pattern, encouraged to achieve toe off B to improve L knee extension mid stance as well as L hip extension Nustep. Level1, mid range for 3 min Ue's and LE's   10/21/23  See initial HEP below   PATIENT EDUCATION:  Education details: HEP update Person educated: Patient Education method: Explanation, Demonstration, and Handouts Education comprehension: verbalized understanding, returned demonstration, and needs further education  HOME EXERCISE PROGRAM: Access Code: [email or web address] URL: [email or web address] Date: 11/07/2023 Prepared by: Josie Burleigh  Exercises - Modified Thomas Stretch  - 1 x daily - 7 x weekly - 3 sets - 3 reps - 1 min  hold - Standing Lumbar Extension  - 1 x daily - 7 x weekly - 3 sets - 3 reps - Seated Hamstring Stretch with Strap  - 1 x daily - 7 x weekly - 2 sets - 2 reps - 30 sec hold - Heel Raises with Counter Support  - 1 x daily - 7 x weekly -  2 sets - 10 reps - Hooklying Isometric Clamshell  - 1 x daily - 7 x weekly - 2 sets - 10 reps - Gastroc Stretch on Wall  - 1 x daily - 7 x weekly - 2 sets - 30 sec hold - Standing Terminal Knee Extension with Resistance  - 1 x daily - 7 x weekly - 2 sets - 10 reps - 3 sec hold - Supine Knee Extension Stretch on Towel Roll  - 1 x daily - 7 x weekly - 2 sets - 30 sec hold - Long Sitting Quad Set with Towel Roll Under Heel  - 1 x daily - 7 x weekly - 2 sets - 10 reps - 3 sec hold - Prone Terminal Knee Extension  - 1 x daily - 7 x weekly - 2 sets - 10 reps - Prone  Quadriceps Stretch with Strap  - 1 x daily - 7 x weekly - 2 sets - 2 reps - 30 sec hold - Prone Knee Extension Hang  - 1 x daily - 7 x weekly - 1 sets - 1 reps - 2 min hold  ASSESSMENT:  CLINICAL IMPRESSION: Worked mostly on knee ROM focusing on extension with some joint mobilizations. This motion had improved to 6 deg after efforts today. GR post session to decrease swelling.    From eval: Patient is a 48 y.o. F who was seen today for physical therapy evaluation and treatment for L TKR on 10/17/23. PMH significant for prior L knee surgery (did not have full range) as well as L side sciatica. Assessment significant for decreased L knee ROM, poor quad activation, edema and L>R LE weakness leading to decreased balance, gait dysfunction, and transfers affecting home and community mobility. Pt will benefit from PT to address these deficits for return to her baseline function.   OBJECTIVE IMPAIRMENTS: Abnormal gait, decreased activity tolerance, decreased balance, decreased endurance, decreased mobility, difficulty walking, decreased ROM, decreased strength, hypomobility, increased edema, increased fascial restrictions, increased muscle spasms, impaired flexibility, impaired sensation, postural dysfunction, and pain.     GOALS: Goals reviewed with patient? Yes   SHORT TERM GOALS: Target date: 11/18/2023   Independent with initial HEP. Baseline: Newly issued on eval Goal status: MET- 10/28/23  2.  Pt will have improved L knee ROM from to 10-90 deg Baseline: 25-80 PROM, 40-75 AROM Goal status: MET- 11/07/23  3.  Pt will be able to wean to [facility] for home mobility utilizing reciprocal gait pattern Baseline: RW utilized with early step through pattern Goal status: INITIAL   LONG TERM GOALS: Target date: 12/16/2023  Independent with advanced/ongoing HEP to improve outcomes and carryover.  Baseline:  Goal status: INITIAL  2.  Dewayne Ford will demonstrate L knee flexion to 110 deg to  ascend/descend stairs. Baseline: 80 deg AROM Goal status: INITIAL  3.  Dewayne Ford will demonstrate L knee extension = to R knee for safety with gait. Baseline: R knee = 5 deg from full ext, L knee = 25 deg from full ext Goal status: INITIAL  4.  Smt. Loder will be able to ambulate 1000' ind and normal gait pattern to access community/work.  Baseline: currently using RW Goal status: INITIAL  5.  Raegyn Renda will be able to ascend/descend stairs with 1 HR and reciprocal step pattern safely to access home and community.  Baseline: not performing stairs Goal status: INITIAL  6.  Vina Byrd will demonstrate > 19/24 on DGI to demonstrate decreased risk of falls.  Baseline: 12/24 Goal status: INITIAL  7.  Dewayne Ford will demonstrate improved functional LE strength by completing 5x STS in <10 seconds.  (MCID 5 seconds) Baseline: 15.79 sec Goal status: INITIAL  8.  Florenda Watt will have improved LEFS to >/=15/80 to demo MCID Baseline: 6/80 Goal status: INITIAL   PLAN:  PT FREQUENCY: 2x/week  PT DURATION: 8 weeks  PLANNED INTERVENTIONS: 97164- PT Re-evaluation, 97110-Therapeutic exercises, 97530- Therapeutic activity, 97112- Neuromuscular re-education, 97535- Self Care, 16109- Manual therapy, [phone]- Gait training, [phone]- Aquatic Therapy, [phone]- Electrical stimulation (unattended), 97035- Ultrasound, 29562- Ionotophoresis 4mg /ml Dexamethasone, Patient/Family education, Balance training, Stair training, Taping, Dry Needling, Joint mobilization, Spinal mobilization, Cryotherapy, and Moist heat  PLAN FOR NEXT SESSION: Work on quad firing, knee ROM, and general L LE strengthening. Gait training   Frederick Klinger L Mattalyn Anderegg, PTA 11/07/2023, 11:00 AM

## 2023-11-11 ENCOUNTER — Ambulatory Visit: Admitting: Physical Therapy

## 2023-11-11 DIAGNOSIS — M25662 Stiffness of left knee, not elsewhere classified: Secondary | ICD-10-CM

## 2023-11-11 DIAGNOSIS — G8929 Other chronic pain: Secondary | ICD-10-CM

## 2023-11-11 DIAGNOSIS — M25562 Pain in left knee: Secondary | ICD-10-CM | POA: Diagnosis not present

## 2023-11-11 DIAGNOSIS — M6281 Muscle weakness (generalized): Secondary | ICD-10-CM

## 2023-11-11 DIAGNOSIS — R262 Difficulty in walking, not elsewhere classified: Secondary | ICD-10-CM

## 2023-11-11 NOTE — Therapy (Signed)
 OUTPATIENT PHYSICAL THERAPY LOWER EXTREMITY TREATMENT   Patient Name: Gabriela Bean MRN: [id number] DOB:1975/12/11, 48 y.o., female Today's Date: 11/11/2023  END OF SESSION:  PT End of Session - 11/11/23 1019     Visit Number 8    Number of Visits 16    Date for PT Re-Evaluation 12/16/23    Authorization Type [employer]    Progress Note Due on Visit 10    PT Start Time 1016    PT Stop Time 1055    PT Time Calculation (min) 39 min    Activity Tolerance Patient tolerated treatment well    Behavior During Therapy WFL for tasks assessed/performed               Past Medical History:  Diagnosis Date   ALLERGIC RHINITIS 01/06/2009   Depression    Family history of BRCA2 gene positive    Family history of breast cancer    Family history of ovarian cancer    Family history of prostate cancer    Infertility, female    VBAC, delivered, current hospitalization 09/01/2012   Past Surgical History:  Procedure Laterality Date   CESAREAN SECTION  03/25/10   KNEE SURGERY Left 2010   torn NCL   Patient Active Problem List   Diagnosis Date Noted   Prediabetes 10/09/2023   Anxiety disorder 04/11/2023   Primary osteoarthritis of both first carpometacarpal joints 08/05/2019   Genetic testing 01/27/2019   Family history of breast cancer    Family history of ovarian cancer    Family history of prostate cancer    Family history of BRCA2 gene positive    Pain in left knee 03/20/2018   ROM (rupture of membranes), premature 09/01/2012   Acute sinus infection 08/30/2012   Dyspnea and respiratory abnormality 08/30/2012   Allergic rhinitis 01/06/2009    PCP: Gavin Kast, FNP  REFERRING PROVIDER: Osa Blase, MD  REFERRING DIAG: [phone] (ICD-10-CM) - S/P total knee replacement  THERAPY DIAG:  Chronic pain of left knee  Stiffness of left knee, not elsewhere classified  Muscle weakness (generalized)  Difficulty in walking, not elsewhere classified  Rationale for Evaluation and  Treatment: Rehabilitation  ONSET DATE: 10/17/23 L TKA  SUBJECTIVE:   SUBJECTIVE STATEMENT:   Pt states she is doing okay. 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 No longer taking any pain medications.   Eval: Pt reports a lot of swelling and she had a lot of bleeding. Has been icing and getting up every hour. Pt was told not to do all of the take home exercises. Will be seeing ortho this afternoon.   PERTINENT HISTORY: L sciatica PAIN:  Are you having pain? Yes: NPRS scale: 6/10 currently Pain location: anterior knee and back of thigh Pain description: dull aching in thigh, sharpness in knee Aggravating factors: Walking, transfers Relieving factors: ice, pain medicine  PRECAUTIONS: None  RED FLAGS: None   WEIGHT BEARING RESTRICTIONS: No  FALLS:  Has patient fallen in last 6 months? No  LIVING ENVIRONMENT: Lives with: lives with their family son and daughter Lives in: House/apartment Stairs: Yes: Internal: 13 steps; on left going up and External: 3 steps; on right going up (staying downstairs for now) Has following equipment at home: Single point cane and Walker - 2 wheeled  OCCUPATION: Therapist -- mostly desk work   PLOF: Independent  PATIENT GOALS: Return to work and normal activities  NEXT MD VISIT:   OBJECTIVE:  Note: Objective measures were completed at Evaluation unless otherwise noted.  DIAGNOSTIC FINDINGS:  nothing new in Epic  PATIENT SURVEYS:  LEFS 6/80 = 7.5%  SENSATION: A little numbness on the front  MUSCLE LENGTH: Hamstrings: Left limited due to edema Thomas test: Did not assess  POSTURE: weight shift right and L knee flexed  PALPATION: TTP ant and posterior L thigh, ant knee  LOWER EXTREMITY ROM:  Active ROM Right eval Left eval 10/23/23 11/07/23  Hip flexion      Hip extension      Hip abduction      Hip adduction      Hip internal rotation      Hip external rotation      Knee flexion 115 AROM 75 AROM 80 PROM 83 AROM   Knee extension -5 AROM  -40 AROM -25 PROM  6  Ankle dorsiflexion      Ankle plantarflexion      Ankle inversion      Ankle eversion       (Blank rows = not tested)  LOWER EXTREMITY MMT:  MMT Right eval Left eval  Hip flexion 5 4*  Hip extension    Hip abduction    Hip adduction    Hip internal rotation    Hip external rotation    Knee flexion 5 4  Knee extension 5 2+  Ankle dorsiflexion 5 5  Ankle plantarflexion    Ankle inversion    Ankle eversion     (Blank rows = not tested, * = pain)  LOWER EXTREMITY SPECIAL TESTS:  Did not assess  FUNCTIONAL TESTS:  5 times sit to stand: 15.79 sec DGI: 12/24  GAIT: Distance walked: Into clinic Assistive device utilized: Environmental consultant - 2 wheeled Level of assistance: Modified independence Comments: Antalgic, decreased L knee flexion during swing, decreased extension during stance, early step through pattern                                                                                                                                TREATMENT DATE:  11/11/23 Nustep L5x97min Prone knee hang x1 min Roller to L hamstring, STM & TPR L hamstring Grade II to III tibiofemoral mobs for ext Prone PNF flex/ext 10x5" Prone quad set 2x10 Prone hip ext 2x10 Prone knee flex with strap 2x30" Prone hamstring curl at end range 10x3" Recumbent bike x 5 min partial revolutions seat 9 into full revolutions Wall squat 2x10 Terminal knee ext ball against wall 2x10 Game Ready x 10 min L knee low compression 34 deg   11/07/23 Nustep L5x78min  Bike x 4 min partial revolutions seat 7  Prone TKE 10x5" Prone quad stretch with strap 2x30" Prone knee extension hang x 2 min Seated gastroc + HS stretch with strap 2x30" Tibia on femur mobs for extension grade III-IV Game Ready x 10 min L knee low compression 34 deg  11/04/23 Supine feet on ball, knee flex/ext x20 Supine feet on ball, hamstring setting with knees flexed 2x10x3" 90/90 knees &  hips x2 min for knee flexion Grade  II to III tibiofemoral knee mob for flexion and then ext Patellar mobs all directions Walk feet back for knee flexion 3x10" (up to 95 deg) Supine feet on ball, knee ext glute set x10 Supine feet on ball, knee ext bridge x10 Supine hamstring stretch with strap x30" Knee ext stretch x2 min Supine hip flexor stretch off EOB 2x30" Hip/calf stretch 2x30" Terminal knee ext red TB 2x10 Sit<>stand 2x10  Amb with and without cane x 2 laps Game Ready x 10 min L knee low compression 34 deg    10/30/23 Therapeutic Exercise: Nustep L5 x 6 min Ues/LEs Seated hamstring stretch 2x30" Seated gastroc stretch with strap x30" Seated adductor stretch 2x30" Supine clamshell red TB 2x10 Supine knee flexion self stretch walking feet  Towel under foot, quad set x10 (15 deg from full ext)  Neuro Re-ed: Supine SAQ + ball squeeze 2x10 Supine SAQ + hip abd against red TB 2x10 Prolonged knee ext with foot on rolled towel 2x1 min SLR with strap assist 2x10 Standing L<>R weight shift with quad set   Manual Therapy: Grade II tibiofemoral joint mobs for knee flexion  Vaso:  Low compression, 10 min, 34 deg around R knee   10/25/23 Therapeutic Exercise: to improve strength, ROM, flexibility, and endurance  Seated heel slides AROM x 10; AAROM with strap x 10 Seated L LAQ x 10 Seated hamstring stretch with strap 2x30" Supine QS with towel under knee x 20 Supine L SLR x 10- decreased TKE but no quad lag Standing heel raises 2x10 Standing L hip extension 2x10  Rec bike x 3 min partial rev GAIT TRAINING: To normalize gait pattern and improve safety. SPC supervision- L hip circumduction on swing phase otherwise stable with cane 180 ft Game Ready x 10 min L knee low compression 34 deg   10/25/23 Therapeutic Exercise: to improve strength, ROM, flexibility, and endurance  Nustep L3x2min UE/LE Seated LAQ 2x5 Seated heel slide x 10 Quad sets 10x5" Thomas stretch 1 min 2x Standing lumbar extension w/ walker  x 10  Gait Training: With walker supervision 360 ft- cues for heel strike, increased knee flexion, increased hip extension, upright posture   Game Ready x 10 min L knee low compression 34 deg   10/23/23 Therapeutic exercise:  Supine for quad sets over 2 towel rolls, used towel roll for tactile stimulus to engage quads better 10 reps Supine for assisted SLR 10 reps Seated L knee flexion slides with foot on pillow case 10 reps Assisted long arc quads L , therapist providing assistance, then transitioned to pt performing with assist with black theraband, 15 reps total cues to sit with thigh supported to avoid utilizing L hip flexors  Therapeutic activity: Raised front of walker for better biomechanical positioning Instructed to continuously move walker, pt using step to pattern, encouraged to achieve toe off B to improve L knee extension mid stance as well as L hip extension Nustep. Level1, mid range for 3 min Ue's and LE's   10/21/23  See initial HEP below   PATIENT EDUCATION:  Education details: HEP update Person educated: Patient Education method: Explanation, Demonstration, and Handouts Education comprehension: verbalized understanding, returned demonstration, and needs further education  HOME EXERCISE PROGRAM: Access Code: [email or web address] URL: [email or web address] Date: 11/07/2023 Prepared by: Braylin Clark  Exercises - Modified Thomas Stretch  - 1 x daily - 7 x weekly - 3 sets - 3 reps - 1 min  hold - Standing Lumbar  Extension  - 1 x daily - 7 x weekly - 3 sets - 3 reps - Seated Hamstring Stretch with Strap  - 1 x daily - 7 x weekly - 2 sets - 2 reps - 30 sec hold - Heel Raises with Counter Support  - 1 x daily - 7 x weekly - 2 sets - 10 reps - Hooklying Isometric Clamshell  - 1 x daily - 7 x weekly - 2 sets - 10 reps - Gastroc Stretch on Wall  - 1 x daily - 7 x weekly - 2 sets - 30 sec hold - Standing Terminal Knee Extension with Resistance  - 1 x daily - 7 x weekly  - 2 sets - 10 reps - 3 sec hold - Supine Knee Extension Stretch on Towel Roll  - 1 x daily - 7 x weekly - 2 sets - 30 sec hold - Long Sitting Quad Set with Towel Roll Under Heel  - 1 x daily - 7 x weekly - 2 sets - 10 reps - 3 sec hold - Prone Terminal Knee Extension  - 1 x daily - 7 x weekly - 2 sets - 10 reps - Prone Quadriceps Stretch with Strap  - 1 x daily - 7 x weekly - 2 sets - 2 reps - 30 sec hold - Prone Knee Extension Hang  - 1 x daily - 7 x weekly - 1 sets - 1 reps - 2 min hold  ASSESSMENT:  CLINICAL IMPRESSION: Continued to work on knee ROM and gross strengthening. Working on improving pt tolerance to knee flexion/ext with full body weight on R LE. Very close to getting full revolutions on the bike.    From eval: Patient is a 48 y.o. F who was seen today for physical therapy evaluation and treatment for L TKR on 10/17/23. PMH significant for prior L knee surgery (did not have full range) as well as L side sciatica. Assessment significant for decreased L knee ROM, poor quad activation, edema and L>R LE weakness leading to decreased balance, gait dysfunction, and transfers affecting home and community mobility. Pt will benefit from PT to address these deficits for return to her baseline function.   OBJECTIVE IMPAIRMENTS: Abnormal gait, decreased activity tolerance, decreased balance, decreased endurance, decreased mobility, difficulty walking, decreased ROM, decreased strength, hypomobility, increased edema, increased fascial restrictions, increased muscle spasms, impaired flexibility, impaired sensation, postural dysfunction, and pain.     GOALS: Goals reviewed with patient? Yes   SHORT TERM GOALS: Target date: 11/18/2023   Independent with initial HEP. Baseline: Newly issued on eval Goal status: MET- 10/28/23  2.  Pt will have improved L knee ROM from to 10-90 deg Baseline: 25-80 PROM, 40-75 AROM Goal status: MET- 11/07/23  3.  Pt will be able to wean to [facility] for home mobility  utilizing reciprocal gait pattern Baseline: RW utilized with early step through pattern Goal status: INITIAL   LONG TERM GOALS: Target date: 12/16/2023  Independent with advanced/ongoing HEP to improve outcomes and carryover.  Baseline:  Goal status: INITIAL  2.  Dewayne Ford will demonstrate L knee flexion to 110 deg to ascend/descend stairs. Baseline: 80 deg AROM Goal status: INITIAL  3.  Dewayne Ford will demonstrate L knee extension = to R knee for safety with gait. Baseline: R knee = 5 deg from full ext, L knee = 25 deg from full ext Goal status: INITIAL  4.  Elbony Sholl will be able to ambulate 1000' ind and normal gait  pattern to access community/work.  Baseline: currently using RW Goal status: INITIAL  5.  Addley Jama will be able to ascend/descend stairs with 1 HR and reciprocal step pattern safely to access home and community.  Baseline: not performing stairs Goal status: INITIAL  6.  Brandice Hugie will demonstrate > 19/24 on DGI to demonstrate decreased risk of falls.   Baseline: 12/24 Goal status: INITIAL  7.  Dewayne Ford will demonstrate improved functional LE strength by completing 5x STS in <10 seconds.  (MCID 5 seconds) Baseline: 15.79 sec Goal status: INITIAL  8.  Brittoni Aland will have improved LEFS to >/=15/80 to demo MCID Baseline: 6/80 Goal status: INITIAL   PLAN:  PT FREQUENCY: 2x/week  PT DURATION: 8 weeks  PLANNED INTERVENTIONS: 97164- PT Re-evaluation, 97110-Therapeutic exercises, 97530- Therapeutic activity, 97112- Neuromuscular re-education, 97535- Self Care, 16109- Manual therapy, [phone]- Gait training, [phone]- Aquatic Therapy, [phone]- Electrical stimulation (unattended), 97035- Ultrasound, 29562- Ionotophoresis 4mg /ml Dexamethasone, Patient/Family education, Balance training, Stair training, Taping, Dry Needling, Joint mobilization, Spinal mobilization, Cryotherapy, and Moist heat  PLAN FOR NEXT SESSION: Work on quad firing, knee ROM,  and general L LE strengthening. Gait training   Raymond Azure April Ma L Kiev Labrosse, PT 11/11/2023, 10:19 AM

## 2023-11-18 ENCOUNTER — Ambulatory Visit: Attending: Orthopedic Surgery | Admitting: Physical Therapy

## 2023-11-18 DIAGNOSIS — R262 Difficulty in walking, not elsewhere classified: Secondary | ICD-10-CM | POA: Diagnosis present

## 2023-11-18 DIAGNOSIS — G8929 Other chronic pain: Secondary | ICD-10-CM | POA: Insufficient documentation

## 2023-11-18 DIAGNOSIS — M6281 Muscle weakness (generalized): Secondary | ICD-10-CM | POA: Insufficient documentation

## 2023-11-18 DIAGNOSIS — M25562 Pain in left knee: Secondary | ICD-10-CM | POA: Insufficient documentation

## 2023-11-18 DIAGNOSIS — M25662 Stiffness of left knee, not elsewhere classified: Secondary | ICD-10-CM | POA: Insufficient documentation

## 2023-11-18 NOTE — Therapy (Signed)
 OUTPATIENT PHYSICAL THERAPY LOWER EXTREMITY TREATMENT   Patient Name: Gabriela Bean MRN: [id number] DOB:Sep 24, 1975, 48 y.o., female Today's Date: 11/18/2023  END OF SESSION:  PT End of Session - 11/18/23 1102     Visit Number 9    Number of Visits 16    Date for PT Re-Evaluation 12/16/23    Authorization Type [employer]    Progress Note Due on Visit 10    PT Start Time 1102    PT Stop Time 1140    PT Time Calculation (min) 38 min    Activity Tolerance Patient tolerated treatment well    Behavior During Therapy WFL for tasks assessed/performed                Past Medical History:  Diagnosis Date   ALLERGIC RHINITIS 01/06/2009   Depression    Family history of BRCA2 gene positive    Family history of breast cancer    Family history of ovarian cancer    Family history of prostate cancer    Infertility, female    VBAC, delivered, current hospitalization 09/01/2012   Past Surgical History:  Procedure Laterality Date   CESAREAN SECTION  03/25/10   KNEE SURGERY Left 2010   torn NCL   Patient Active Problem List   Diagnosis Date Noted   Prediabetes 10/09/2023   Anxiety disorder 04/11/2023   Primary osteoarthritis of both first carpometacarpal joints 08/05/2019   Genetic testing 01/27/2019   Family history of breast cancer    Family history of ovarian cancer    Family history of prostate cancer    Family history of BRCA2 gene positive    Pain in left knee 03/20/2018   ROM (rupture of membranes), premature 09/01/2012   Acute sinus infection 08/30/2012   Dyspnea and respiratory abnormality 08/30/2012   Allergic rhinitis 01/06/2009    PCP: Gavin Kast, FNP  REFERRING PROVIDER: Osa Blase, MD  REFERRING DIAG: [phone] (ICD-10-CM) - S/P total knee replacement  THERAPY DIAG:  Chronic pain of left knee  Stiffness of left knee, not elsewhere classified  Muscle weakness (generalized)  Rationale for Evaluation and Treatment: Rehabilitation  ONSET DATE: 10/17/23 L  TKA  SUBJECTIVE:   SUBJECTIVE STATEMENT:   Pt states knee is kinda tight but doing okay. Has not been weaning off the cane.   Eval: Pt reports a lot of swelling and she had a lot of bleeding. Has been icing and getting up every hour. Pt was told not to do all of the take home exercises. Will be seeing ortho this afternoon.   PERTINENT HISTORY: L sciatica PAIN:  Are you having pain? Yes: NPRS scale: 6/10 currently Pain location: anterior knee and back of thigh Pain description: dull aching in thigh, sharpness in knee Aggravating factors: Walking, transfers Relieving factors: ice, pain medicine  PRECAUTIONS: None  RED FLAGS: None   WEIGHT BEARING RESTRICTIONS: No  FALLS:  Has patient fallen in last 6 months? No  LIVING ENVIRONMENT: Lives with: lives with their family son and daughter Lives in: House/apartment Stairs: Yes: Internal: 13 steps; on left going up and External: 3 steps; on right going up (staying downstairs for now) Has following equipment at home: Single point cane and Walker - 2 wheeled  OCCUPATION: Therapist -- mostly desk work   PLOF: Independent  PATIENT GOALS: Return to work and normal activities  NEXT MD VISIT: 11/27/23  OBJECTIVE:  Note: Objective measures were completed at Evaluation unless otherwise noted.  DIAGNOSTIC FINDINGS: nothing new in Epic  PATIENT  SURVEYS:  LEFS 6/80 = 7.5%  SENSATION: A little numbness on the front  MUSCLE LENGTH: Hamstrings: Left limited due to edema Thomas test: Did not assess  POSTURE: weight shift right and L knee flexed  PALPATION: TTP ant and posterior L thigh, ant knee  LOWER EXTREMITY ROM:  Active ROM Right eval Left eval 10/23/23 11/07/23 11/18/23  Hip flexion       Hip extension       Hip abduction       Hip adduction       Hip internal rotation       Hip external rotation       Knee flexion 115 AROM 75 AROM 80 PROM 83 AROM  105 PROM  Knee extension -5 AROM -40 AROM -25 PROM  6   Ankle  dorsiflexion       Ankle plantarflexion       Ankle inversion       Ankle eversion        (Blank rows = not tested)  LOWER EXTREMITY MMT:  MMT Right eval Left eval  Hip flexion 5 4*  Hip extension    Hip abduction    Hip adduction    Hip internal rotation    Hip external rotation    Knee flexion 5 4  Knee extension 5 2+  Ankle dorsiflexion 5 5  Ankle plantarflexion    Ankle inversion    Ankle eversion     (Blank rows = not tested, * = pain)  LOWER EXTREMITY SPECIAL TESTS:  Did not assess  FUNCTIONAL TESTS:  5 times sit to stand: 15.79 sec DGI: 12/24  GAIT: Distance walked: Into clinic Assistive device utilized: Environmental consultant - 2 wheeled Level of assistance: Modified independence Comments: Antalgic, decreased L knee flexion during swing, decreased extension during stance, early step through pattern                                                                                                                                TREATMENT DATE:  11/18/23 Seated hamstring stretch 2x30" Prone knee hang 3x 1 min Prone knee hang with PNF contract/relax knee flex/ext 10x3" Prone hamstring curl 3# 2x10 Prone hip ext with knee flexed 2x10 Manual therapy: IASTM L hamstring, grade II to III tibiofemoral mobs for ext Standing hip abd 3# 2x10 Standing hip ext 3# 2x10 Standing gastroc stretch x30" Counter squat touch to chair 2x10 Seated knee flexion self stretch 3x30" Recumbent bike seat 8; 2 min forward (focus on keeping ankle DF), 2 min bwd Game Ready x 15 min L knee low compression 34 deg   11/11/23 Nustep L5x77min Prone knee hang x1 min Roller to L hamstring, STM & TPR L hamstring Grade II to III tibiofemoral mobs for ext Prone PNF flex/ext 10x5" Prone quad set 2x10 Prone hip ext 2x10 Prone knee flex with strap 2x30" Prone hamstring curl at end range 10x3" Recumbent bike x 5 min partial  revolutions seat 9 into full revolutions Wall squat 2x10 Terminal knee ext ball against  wall 2x10 Game Ready x 10 min L knee low compression 34 deg   11/07/23 Nustep L5x28min  Bike x 4 min partial revolutions seat 7  Prone TKE 10x5" Prone quad stretch with strap 2x30" Prone knee extension hang x 2 min Seated gastroc + HS stretch with strap 2x30" Tibia on femur mobs for extension grade III-IV Game Ready x 10 min L knee low compression 34 deg  11/04/23 Supine feet on ball, knee flex/ext x20 Supine feet on ball, hamstring setting with knees flexed 2x10x3" 90/90 knees & hips x2 min for knee flexion Grade II to III tibiofemoral knee mob for flexion and then ext Patellar mobs all directions Walk feet back for knee flexion 3x10" (up to 95 deg) Supine feet on ball, knee ext glute set x10 Supine feet on ball, knee ext bridge x10 Supine hamstring stretch with strap x30" Knee ext stretch x2 min Supine hip flexor stretch off EOB 2x30" Hip/calf stretch 2x30" Terminal knee ext red TB 2x10 Sit<>stand 2x10  Amb with and without cane x 2 laps Game Ready x 10 min L knee low compression 34 deg    10/30/23 Therapeutic Exercise: Nustep L5 x 6 min Ues/LEs Seated hamstring stretch 2x30" Seated gastroc stretch with strap x30" Seated adductor stretch 2x30" Supine clamshell red TB 2x10 Supine knee flexion self stretch walking feet  Towel under foot, quad set x10 (15 deg from full ext)  Neuro Re-ed: Supine SAQ + ball squeeze 2x10 Supine SAQ + hip abd against red TB 2x10 Prolonged knee ext with foot on rolled towel 2x1 min SLR with strap assist 2x10 Standing L<>R weight shift with quad set   Manual Therapy: Grade II tibiofemoral joint mobs for knee flexion  Vaso:  Low compression, 10 min, 34 deg around R knee   10/25/23 Therapeutic Exercise: to improve strength, ROM, flexibility, and endurance  Seated heel slides AROM x 10; AAROM with strap x 10 Seated L LAQ x 10 Seated hamstring stretch with strap 2x30" Supine QS with towel under knee x 20 Supine L SLR x 10- decreased TKE  but no quad lag Standing heel raises 2x10 Standing L hip extension 2x10  Rec bike x 3 min partial rev GAIT TRAINING: To normalize gait pattern and improve safety. SPC supervision- L hip circumduction on swing phase otherwise stable with cane 180 ft Game Ready x 10 min L knee low compression 34 deg   10/25/23 Therapeutic Exercise: to improve strength, ROM, flexibility, and endurance  Nustep L3x62min UE/LE Seated LAQ 2x5 Seated heel slide x 10 Quad sets 10x5" Thomas stretch 1 min 2x Standing lumbar extension w/ walker x 10  Gait Training: With walker supervision 360 ft- cues for heel strike, increased knee flexion, increased hip extension, upright posture   Game Ready x 10 min L knee low compression 34 deg   10/23/23 Therapeutic exercise:  Supine for quad sets over 2 towel rolls, used towel roll for tactile stimulus to engage quads better 10 reps Supine for assisted SLR 10 reps Seated L knee flexion slides with foot on pillow case 10 reps Assisted long arc quads L , therapist providing assistance, then transitioned to pt performing with assist with black theraband, 15 reps total cues to sit with thigh supported to avoid utilizing L hip flexors  Therapeutic activity: Raised front of walker for better biomechanical positioning Instructed to continuously move walker, pt using step to pattern, encouraged to  achieve toe off B to improve L knee extension mid stance as well as L hip extension Nustep. Level1, mid range for 3 min Ue's and LE's   10/21/23  See initial HEP below   PATIENT EDUCATION:  Education details: HEP update Person educated: Patient Education method: Explanation, Demonstration, and Handouts Education comprehension: verbalized understanding, returned demonstration, and needs further education  HOME EXERCISE PROGRAM: Access Code: [email or web address] URL: [email or web address] Date: 11/07/2023 Prepared by: Braylin Clark  Exercises - Modified Thomas Stretch  - 1  x daily - 7 x weekly - 3 sets - 3 reps - 1 min  hold - Standing Lumbar Extension  - 1 x daily - 7 x weekly - 3 sets - 3 reps - Seated Hamstring Stretch with Strap  - 1 x daily - 7 x weekly - 2 sets - 2 reps - 30 sec hold - Heel Raises with Counter Support  - 1 x daily - 7 x weekly - 2 sets - 10 reps - Hooklying Isometric Clamshell  - 1 x daily - 7 x weekly - 2 sets - 10 reps - Gastroc Stretch on Wall  - 1 x daily - 7 x weekly - 2 sets - 30 sec hold - Standing Terminal Knee Extension with Resistance  - 1 x daily - 7 x weekly - 2 sets - 10 reps - 3 sec hold - Supine Knee Extension Stretch on Towel Roll  - 1 x daily - 7 x weekly - 2 sets - 30 sec hold - Long Sitting Quad Set with Towel Roll Under Heel  - 1 x daily - 7 x weekly - 2 sets - 10 reps - 3 sec hold - Prone Terminal Knee Extension  - 1 x daily - 7 x weekly - 2 sets - 10 reps - Prone Quadriceps Stretch with Strap  - 1 x daily - 7 x weekly - 2 sets - 2 reps - 30 sec hold - Prone Knee Extension Hang  - 1 x daily - 7 x weekly - 1 sets - 1 reps - 2 min hold  ASSESSMENT:  CLINICAL IMPRESSION: Pt is getting closer to her L knee flexion ROM goals. Actively still weak at end ranges but improving overall. Hamstring remains tight limiting knee extension. Continued to work gross L LE strength and stability. Compensates at ankle to get knee flexed enough to get full revolutions on the bike.    From eval: Patient is a 48 y.o. F who was seen today for physical therapy evaluation and treatment for L TKR on 10/17/23. PMH significant for prior L knee surgery (did not have full range) as well as L side sciatica. Assessment significant for decreased L knee ROM, poor quad activation, edema and L>R LE weakness leading to decreased balance, gait dysfunction, and transfers affecting home and community mobility. Pt will benefit from PT to address these deficits for return to her baseline function.   OBJECTIVE IMPAIRMENTS: Abnormal gait, decreased activity tolerance,  decreased balance, decreased endurance, decreased mobility, difficulty walking, decreased ROM, decreased strength, hypomobility, increased edema, increased fascial restrictions, increased muscle spasms, impaired flexibility, impaired sensation, postural dysfunction, and pain.     GOALS: Goals reviewed with patient? Yes   SHORT TERM GOALS: Target date: 11/18/2023   Independent with initial HEP. Baseline: Newly issued on eval Goal status: MET- 10/28/23  2.  Pt will have improved L knee ROM from to 10-90 deg Baseline: 25-80 PROM, 40-75 AROM Goal status: MET- 11/07/23  3.  Pt will  be able to wean to [facility] for home mobility utilizing reciprocal gait pattern Baseline: RW utilized with early step through pattern Goal status: MET - 11/18/23   LONG TERM GOALS: Target date: 12/16/2023  Independent with advanced/ongoing HEP to improve outcomes and carryover.  Baseline:  Goal status: INITIAL  2.  Dewayne Ford will demonstrate L knee flexion to 110 deg to ascend/descend stairs. Baseline: 80 deg AROM Goal status: INITIAL  3.  Dewayne Ford will demonstrate L knee extension = to R knee for safety with gait. Baseline: R knee = 5 deg from full ext, L knee = 25 deg from full ext Goal status: INITIAL  4.  Helen Folck will be able to ambulate 1000' ind and normal gait pattern to access community/work.  Baseline: currently using RW Goal status: INITIAL  5.  Rotha Laur will be able to ascend/descend stairs with 1 HR and reciprocal step pattern safely to access home and community.  Baseline: not performing stairs Goal status: INITIAL  6.  Knoelle Woller will demonstrate > 19/24 on DGI to demonstrate decreased risk of falls.   Baseline: 12/24 Goal status: INITIAL  7.  Dewayne Ford will demonstrate improved functional LE strength by completing 5x STS in <10 seconds.  (MCID 5 seconds) Baseline: 15.79 sec Goal status: INITIAL  8.  Michelle Redpath will have improved LEFS to >/=15/80 to demo  MCID Baseline: 6/80 Goal status: INITIAL   PLAN:  PT FREQUENCY: 2x/week  PT DURATION: 8 weeks  PLANNED INTERVENTIONS: 97164- PT Re-evaluation, 97110-Therapeutic exercises, 97530- Therapeutic activity, 97112- Neuromuscular re-education, 97535- Self Care, 96045- Manual therapy, [phone]- Gait training, [phone]- Aquatic Therapy, [phone]- Electrical stimulation (unattended), 97035- Ultrasound, 21308- Ionotophoresis 4mg /ml Dexamethasone, Patient/Family education, Balance training, Stair training, Taping, Dry Needling, Joint mobilization, Spinal mobilization, Cryotherapy, and Moist heat  PLAN FOR NEXT SESSION: Work on quad firing, knee ROM, and general L LE strengthening. Gait training   Addam Goeller April Ma L Leeanne Butters, PT 11/18/2023, 11:04 AM

## 2023-11-21 ENCOUNTER — Ambulatory Visit

## 2023-11-25 ENCOUNTER — Ambulatory Visit: Admitting: Physical Therapy

## 2023-11-25 DIAGNOSIS — M25662 Stiffness of left knee, not elsewhere classified: Secondary | ICD-10-CM

## 2023-11-25 DIAGNOSIS — M25562 Pain in left knee: Secondary | ICD-10-CM | POA: Diagnosis not present

## 2023-11-25 DIAGNOSIS — M6281 Muscle weakness (generalized): Secondary | ICD-10-CM

## 2023-11-25 DIAGNOSIS — G8929 Other chronic pain: Secondary | ICD-10-CM

## 2023-11-25 DIAGNOSIS — R262 Difficulty in walking, not elsewhere classified: Secondary | ICD-10-CM

## 2023-11-25 NOTE — Therapy (Signed)
 OUTPATIENT PHYSICAL THERAPY LOWER EXTREMITY TREATMENT   Patient Name: Gabriela Bean MRN: [id number] DOB:01/17/1976, 48 y.o., female Today's Date: 11/25/2023  END OF SESSION:  PT End of Session - 11/25/23 1021     Visit Number 10    Number of Visits 16    Date for PT Re-Evaluation 12/16/23    Authorization Type [employer]    Progress Note Due on Visit 10    PT Start Time 1016    PT Stop Time 1100    PT Time Calculation (min) 44 min    Activity Tolerance Patient tolerated treatment well    Behavior During Therapy WFL for tasks assessed/performed                 Past Medical History:  Diagnosis Date   ALLERGIC RHINITIS 01/06/2009   Depression    Family history of BRCA2 gene positive    Family history of breast cancer    Family history of ovarian cancer    Family history of prostate cancer    Infertility, female    VBAC, delivered, current hospitalization 09/01/2012   Past Surgical History:  Procedure Laterality Date   CESAREAN SECTION  03/25/10   KNEE SURGERY Left 2010   torn NCL   Patient Active Problem List   Diagnosis Date Noted   Prediabetes 10/09/2023   Anxiety disorder 04/11/2023   Primary osteoarthritis of both first carpometacarpal joints 08/05/2019   Genetic testing 01/27/2019   Family history of breast cancer    Family history of ovarian cancer    Family history of prostate cancer    Family history of BRCA2 gene positive    Pain in left knee 03/20/2018   ROM (rupture of membranes), premature 09/01/2012   Acute sinus infection 08/30/2012   Dyspnea and respiratory abnormality 08/30/2012   Allergic rhinitis 01/06/2009    PCP: Gavin Kast, FNP  REFERRING PROVIDER: Osa Blase, MD  REFERRING DIAG: [phone] (ICD-10-CM) - S/P total knee replacement  THERAPY DIAG:  Chronic pain of left knee  Stiffness of left knee, not elsewhere classified  Muscle weakness (generalized)  Difficulty in walking, not elsewhere classified  Rationale for Evaluation  and Treatment: Rehabilitation  ONSET DATE: 10/17/23 L TKA  SUBJECTIVE:   SUBJECTIVE STATEMENT:   Pt states she went to her massage therapist and got work on her quads and hamstrings. Still has a lot of swelling.    Eval: Pt reports a lot of swelling and she had a lot of bleeding. Has been icing and getting up every hour. Pt was told not to do all of the take home exercises. Will be seeing ortho this afternoon.   PERTINENT HISTORY: L sciatica PAIN:  Are you having pain? Yes: NPRS scale: 6/10 currently Pain location: anterior knee and back of thigh Pain description: dull aching in thigh, sharpness in knee Aggravating factors: Walking, transfers Relieving factors: ice, pain medicine  PRECAUTIONS: None  RED FLAGS: None   WEIGHT BEARING RESTRICTIONS: No  FALLS:  Has patient fallen in last 6 months? No  LIVING ENVIRONMENT: Lives with: lives with their family son and daughter Lives in: House/apartment Stairs: Yes: Internal: 13 steps; on left going up and External: 3 steps; on right going up (staying downstairs for now) Has following equipment at home: Single point cane and Walker - 2 wheeled  OCCUPATION: Therapist -- mostly desk work   PLOF: Independent  PATIENT GOALS: Return to work and normal activities  NEXT MD VISIT: 11/27/23  OBJECTIVE:  Note: Objective measures were  completed at Evaluation unless otherwise noted.  DIAGNOSTIC FINDINGS: nothing new in Epic  PATIENT SURVEYS:  LEFS 6/80 = 7.5%  SENSATION: A little numbness on the front  MUSCLE LENGTH: Hamstrings: Left limited due to edema Thomas test: Did not assess  POSTURE: weight shift right and L knee flexed  PALPATION: TTP ant and posterior L thigh, ant knee  LOWER EXTREMITY ROM:  Active ROM Right eval Left eval 10/23/23 11/07/23 11/18/23 11/25/23  Hip flexion        Hip extension        Hip abduction        Hip adduction        Hip internal rotation        Hip external rotation        Knee flexion  115 AROM 75 AROM 80 PROM 83 AROM  105 PROM 110 PROM 105 AROM  Knee extension -5 AROM -40 AROM -25 PROM  6  -6 PROM -10 AROM  Ankle dorsiflexion        Ankle plantarflexion        Ankle inversion        Ankle eversion         (Blank rows = not tested)  LOWER EXTREMITY MMT:  MMT Right eval Left eval  Hip flexion 5 4*  Hip extension    Hip abduction    Hip adduction    Hip internal rotation    Hip external rotation    Knee flexion 5 4  Knee extension 5 2+  Ankle dorsiflexion 5 5  Ankle plantarflexion    Ankle inversion    Ankle eversion     (Blank rows = not tested, * = pain)  LOWER EXTREMITY SPECIAL TESTS:  Did not assess  FUNCTIONAL TESTS:  5 times sit to stand: 15.79 sec DGI: 12/24  GAIT: Distance walked: Into clinic Assistive device utilized: Environmental consultant - 2 wheeled Level of assistance: Modified independence Comments: Antalgic, decreased L knee flexion during swing, decreased extension during stance, early step through pattern                                                                                                                                TREATMENT DATE:  11/25/23 Supine feet on pball knee flexion 2x10, with towel for increased knee flexion 2x10 Supine bridge knees bent on pball 2x10 Supine bridge knees straight on pball 2x10 Prone knee hang with 2# weight x2 min Prone knee hang with quad set 10x3", with PT MWM for knee extension 2x10  Supine quad set with PT overpressure 2x10 Recumbent bike seat 8 x 3 min, seat 7 x 2 min, seat 6 x 1 min  Double leg leg press 45# 2x10 Single leg leg press 20# x10 Single leg standing, cone tap fwd & to the side 2x10 Staggered stance, heel strike with quad set into stance phase tap fwd/bwd 2x10 Side step with red TB x5 laps by  counter Monster walk fwd/bwd red TB x5 laps by counter Game Ready x 10 min L knee low compression 34 deg    11/18/23 Seated hamstring stretch 2x30" Prone knee hang 3x 1 min Prone knee hang  with PNF contract/relax knee flex/ext 10x3" Prone hamstring curl 3# 2x10 Prone hip ext with knee flexed 2x10 Manual therapy: IASTM L hamstring, grade II to III tibiofemoral mobs for ext Standing hip abd 3# 2x10 Standing hip ext 3# 2x10 Standing gastroc stretch x30" Counter squat touch to chair 2x10 Seated knee flexion self stretch 3x30" Recumbent bike seat 8; 2 min forward (focus on keeping ankle DF), 2 min bwd Game Ready x 15 min L knee low compression 34 deg   11/11/23 Nustep L5x60min Prone knee hang x1 min Roller to L hamstring, STM & TPR L hamstring Grade II to III tibiofemoral mobs for ext Prone PNF flex/ext 10x5" Prone quad set 2x10 Prone hip ext 2x10 Prone knee flex with strap 2x30" Prone hamstring curl at end range 10x3" Recumbent bike x 5 min partial revolutions seat 9 into full revolutions Wall squat 2x10 Terminal knee ext ball against wall 2x10 Game Ready x 10 min L knee low compression 34 deg   11/07/23 Nustep L5x64min  Bike x 4 min partial revolutions seat 7  Prone TKE 10x5" Prone quad stretch with strap 2x30" Prone knee extension hang x 2 min Seated gastroc + HS stretch with strap 2x30" Tibia on femur mobs for extension grade III-IV Game Ready x 10 min L knee low compression 34 deg  11/04/23 Supine feet on ball, knee flex/ext x20 Supine feet on ball, hamstring setting with knees flexed 2x10x3" 90/90 knees & hips x2 min for knee flexion Grade II to III tibiofemoral knee mob for flexion and then ext Patellar mobs all directions Walk feet back for knee flexion 3x10" (up to 95 deg) Supine feet on ball, knee ext glute set x10 Supine feet on ball, knee ext bridge x10 Supine hamstring stretch with strap x30" Knee ext stretch x2 min Supine hip flexor stretch off EOB 2x30" Hip/calf stretch 2x30" Terminal knee ext red TB 2x10 Sit<>stand 2x10  Amb with and without cane x 2 laps Game Ready x 10 min L knee low compression 34 deg    10/30/23 Therapeutic  Exercise: Nustep L5 x 6 min Ues/LEs Seated hamstring stretch 2x30" Seated gastroc stretch with strap x30" Seated adductor stretch 2x30" Supine clamshell red TB 2x10 Supine knee flexion self stretch walking feet  Towel under foot, quad set x10 (15 deg from full ext)  Neuro Re-ed: Supine SAQ + ball squeeze 2x10 Supine SAQ + hip abd against red TB 2x10 Prolonged knee ext with foot on rolled towel 2x1 min SLR with strap assist 2x10 Standing L<>R weight shift with quad set   Manual Therapy: Grade II tibiofemoral joint mobs for knee flexion  Vaso:  Low compression, 10 min, 34 deg around R knee   10/25/23 Therapeutic Exercise: to improve strength, ROM, flexibility, and endurance  Seated heel slides AROM x 10; AAROM with strap x 10 Seated L LAQ x 10 Seated hamstring stretch with strap 2x30" Supine QS with towel under knee x 20 Supine L SLR x 10- decreased TKE but no quad lag Standing heel raises 2x10 Standing L hip extension 2x10  Rec bike x 3 min partial rev GAIT TRAINING: To normalize gait pattern and improve safety. SPC supervision- L hip circumduction on swing phase otherwise stable with cane 180 ft Game Ready x 10 min  L knee low compression 34 deg   10/25/23 Therapeutic Exercise: to improve strength, ROM, flexibility, and endurance  Nustep L3x77min UE/LE Seated LAQ 2x5 Seated heel slide x 10 Quad sets 10x5" Thomas stretch 1 min 2x Standing lumbar extension w/ walker x 10  Gait Training: With walker supervision 360 ft- cues for heel strike, increased knee flexion, increased hip extension, upright posture   Game Ready x 10 min L knee low compression 34 deg    PATIENT EDUCATION:  Education details: HEP update Person educated: Patient Education method: Explanation, Demonstration, and Handouts Education comprehension: verbalized understanding, returned demonstration, and needs further education  HOME EXERCISE PROGRAM: Access Code: [email or web address] URL:  [email or web address] Date: 11/07/2023 Prepared by: Braylin Clark  Exercises - Modified Thomas Stretch  - 1 x daily - 7 x weekly - 3 sets - 3 reps - 1 min  hold - Standing Lumbar Extension  - 1 x daily - 7 x weekly - 3 sets - 3 reps - Seated Hamstring Stretch with Strap  - 1 x daily - 7 x weekly - 2 sets - 2 reps - 30 sec hold - Heel Raises with Counter Support  - 1 x daily - 7 x weekly - 2 sets - 10 reps - Hooklying Isometric Clamshell  - 1 x daily - 7 x weekly - 2 sets - 10 reps - Gastroc Stretch on Wall  - 1 x daily - 7 x weekly - 2 sets - 30 sec hold - Standing Terminal Knee Extension with Resistance  - 1 x daily - 7 x weekly - 2 sets - 10 reps - 3 sec hold - Supine Knee Extension Stretch on Towel Roll  - 1 x daily - 7 x weekly - 2 sets - 30 sec hold - Long Sitting Quad Set with Towel Roll Under Heel  - 1 x daily - 7 x weekly - 2 sets - 10 reps - 3 sec hold - Prone Terminal Knee Extension  - 1 x daily - 7 x weekly - 2 sets - 10 reps - Prone Quadriceps Stretch with Strap  - 1 x daily - 7 x weekly - 2 sets - 2 reps - 30 sec hold - Prone Knee Extension Hang  - 1 x daily - 7 x weekly - 1 sets - 1 reps - 2 min hold  ASSESSMENT:  CLINICAL IMPRESSION: Pt with improving knee AROM. PROM is slowly starting to hit a plateau but will continue to provide manual overpressure and joint mobilizations to continue to improve range. Still remains limited by her edema. Continued strengthening and stabilization exercises.    From eval: Patient is a 48 y.o. F who was seen today for physical therapy evaluation and treatment for L TKR on 10/17/23. PMH significant for prior L knee surgery (did not have full range) as well as L side sciatica. Assessment significant for decreased L knee ROM, poor quad activation, edema and L>R LE weakness leading to decreased balance, gait dysfunction, and transfers affecting home and community mobility. Pt will benefit from PT to address these deficits for return to  her baseline function.   OBJECTIVE IMPAIRMENTS: Abnormal gait, decreased activity tolerance, decreased balance, decreased endurance, decreased mobility, difficulty walking, decreased ROM, decreased strength, hypomobility, increased edema, increased fascial restrictions, increased muscle spasms, impaired flexibility, impaired sensation, postural dysfunction, and pain.     GOALS: Goals reviewed with patient? Yes   SHORT TERM GOALS: Target date: 11/18/2023   Independent with initial HEP. Baseline: Newly issued on  eval Goal status: MET- 10/28/23  2.  Pt will have improved L knee ROM from to 10-90 deg Baseline: 25-80 PROM, 40-75 AROM Goal status: MET- 11/07/23  3.  Pt will be able to wean to [facility] for home mobility utilizing reciprocal gait pattern Baseline: RW utilized with early step through pattern Goal status: MET - 11/18/23   LONG TERM GOALS: Target date: 12/16/2023  Independent with advanced/ongoing HEP to improve outcomes and carryover.  Baseline:  Goal status: INITIAL  2.  Dewayne Ford will demonstrate L knee flexion to 110 deg to ascend/descend stairs. Baseline: 80 deg AROM Goal status: INITIAL  3.  Dewayne Ford will demonstrate L knee extension = to R knee for safety with gait. Baseline: R knee = 5 deg from full ext, L knee = 25 deg from full ext Goal status: INITIAL  4.  Deyonna Viehman will be able to ambulate 1000' ind and normal gait pattern to access community/work.  Baseline: currently using RW Goal status: INITIAL  5.  Kassi Sweetland will be able to ascend/descend stairs with 1 HR and reciprocal step pattern safely to access home and community.  Baseline: not performing stairs Goal status: INITIAL  6.  Guillermina Stanke will demonstrate > 19/24 on DGI to demonstrate decreased risk of falls.   Baseline: 12/24 Goal status: INITIAL  7.  Dewayne Ford will demonstrate improved functional LE strength by completing 5x STS in <10 seconds.  (MCID 5 seconds) Baseline: 15.79  sec Goal status: INITIAL  8.  Porsha Aragon will have improved LEFS to >/=15/80 to demo MCID Baseline: 6/80 Goal status: INITIAL   PLAN:  PT FREQUENCY: 2x/week  PT DURATION: 8 weeks  PLANNED INTERVENTIONS: 97164- PT Re-evaluation, 97110-Therapeutic exercises, 97530- Therapeutic activity, 97112- Neuromuscular re-education, 97535- Self Care, 16109- Manual therapy, [phone]- Gait training, [phone]- Aquatic Therapy, [phone]- Electrical stimulation (unattended), 97035- Ultrasound, 29562- Ionotophoresis 4mg /ml Dexamethasone, Patient/Family education, Balance training, Stair training, Taping, Dry Needling, Joint mobilization, Spinal mobilization, Cryotherapy, and Moist heat  PLAN FOR NEXT SESSION: Work on quad firing, knee ROM, and general L LE strengthening. Gait training   Delena Casebeer April Ma L Laurencia Roma, PT 11/25/2023, 10:21 AM

## 2023-11-28 ENCOUNTER — Ambulatory Visit

## 2023-11-28 DIAGNOSIS — R262 Difficulty in walking, not elsewhere classified: Secondary | ICD-10-CM

## 2023-11-28 DIAGNOSIS — G8929 Other chronic pain: Secondary | ICD-10-CM

## 2023-11-28 DIAGNOSIS — M25562 Pain in left knee: Secondary | ICD-10-CM | POA: Diagnosis not present

## 2023-11-28 DIAGNOSIS — M6281 Muscle weakness (generalized): Secondary | ICD-10-CM

## 2023-11-28 DIAGNOSIS — M25662 Stiffness of left knee, not elsewhere classified: Secondary | ICD-10-CM

## 2023-11-28 NOTE — Therapy (Signed)
 OUTPATIENT PHYSICAL THERAPY LOWER EXTREMITY TREATMENT   Patient Name: Gabriela Bean MRN: [id number] DOB:January 27, 1976, 48 y.o., female Today's Date: 11/28/2023  END OF SESSION:        Past Medical History:  Diagnosis Date   ALLERGIC RHINITIS 01/06/2009   Depression    Family history of BRCA2 gene positive    Family history of breast cancer    Family history of ovarian cancer    Family history of prostate cancer    Infertility, female    VBAC, delivered, current hospitalization 09/01/2012   Past Surgical History:  Procedure Laterality Date   CESAREAN SECTION  03/25/10   KNEE SURGERY Left 2010   torn NCL   Patient Active Problem List   Diagnosis Date Noted   Prediabetes 10/09/2023   Anxiety disorder 04/11/2023   Primary osteoarthritis of both first carpometacarpal joints 08/05/2019   Genetic testing 01/27/2019   Family history of breast cancer    Family history of ovarian cancer    Family history of prostate cancer    Family history of BRCA2 gene positive    Pain in left knee 03/20/2018   ROM (rupture of membranes), premature 09/01/2012   Acute sinus infection 08/30/2012   Dyspnea and respiratory abnormality 08/30/2012   Allergic rhinitis 01/06/2009    PCP: Gavin Kast, FNP  REFERRING PROVIDER: Osa Blase, MD  REFERRING DIAG: [phone] (ICD-10-CM) - S/P total knee replacement  THERAPY DIAG:  Chronic pain of left knee  Stiffness of left knee, not elsewhere classified  Muscle weakness (generalized)  Difficulty in walking, not elsewhere classified  Rationale for Evaluation and Treatment: Rehabilitation  ONSET DATE: 10/17/23 L TKA  SUBJECTIVE:   SUBJECTIVE STATEMENT:   Pt reports her surgeon was pleased but he does want her to work more on extension.  Eval: Pt reports a lot of swelling and she had a lot of bleeding. Has been icing and getting up every hour. Pt was told not to do all of the take home exercises. Will be seeing ortho this afternoon.    PERTINENT HISTORY: L sciatica PAIN:  Are you having pain? Yes: NPRS scale: 3/10 currently Pain location: anterior knee and back of thigh Pain description: dull aching in thigh, sharpness in knee Aggravating factors: Walking, transfers Relieving factors: ice, pain medicine  PRECAUTIONS: None  RED FLAGS: None   WEIGHT BEARING RESTRICTIONS: No  FALLS:  Has patient fallen in last 6 months? No  LIVING ENVIRONMENT: Lives with: lives with their family son and daughter Lives in: House/apartment Stairs: Yes: Internal: 13 steps; on left going up and External: 3 steps; on right going up (staying downstairs for now) Has following equipment at home: Single point cane and Walker - 2 wheeled  OCCUPATION: Therapist -- mostly desk work   PLOF: Independent  PATIENT GOALS: Return to work and normal activities  NEXT MD VISIT: 11/27/23  OBJECTIVE:  Note: Objective measures were completed at Evaluation unless otherwise noted.  DIAGNOSTIC FINDINGS: nothing new in Epic  PATIENT SURVEYS:  LEFS 6/80 = 7.5%  SENSATION: A little numbness on the front  MUSCLE LENGTH: Hamstrings: Left limited due to edema Thomas test: Did not assess  POSTURE: weight shift right and L knee flexed  PALPATION: TTP ant and posterior L thigh, ant knee  LOWER EXTREMITY ROM:  Active ROM Right eval Left eval 10/23/23 11/07/23 11/18/23 11/25/23  Hip flexion        Hip extension        Hip abduction  Hip adduction        Hip internal rotation        Hip external rotation        Knee flexion 115 AROM 75 AROM 80 PROM 83 AROM  105 PROM 110 PROM 105 AROM  Knee extension -5 AROM -40 AROM -25 PROM  6  -6 PROM -10 AROM  Ankle dorsiflexion        Ankle plantarflexion        Ankle inversion        Ankle eversion         (Blank rows = not tested)  LOWER EXTREMITY MMT:  MMT Right eval Left eval  Hip flexion 5 4*  Hip extension    Hip abduction    Hip adduction    Hip internal rotation    Hip  external rotation    Knee flexion 5 4  Knee extension 5 2+  Ankle dorsiflexion 5 5  Ankle plantarflexion    Ankle inversion    Ankle eversion     (Blank rows = not tested, * = pain)  LOWER EXTREMITY SPECIAL TESTS:  Did not assess  FUNCTIONAL TESTS:  5 times sit to stand: 15.79 sec DGI: 12/24  GAIT: Distance walked: Into clinic Assistive device utilized: Environmental consultant - 2 wheeled Level of assistance: Modified independence Comments: Antalgic, decreased L knee flexion during swing, decreased extension during stance, early step through pattern                                                                                                                                TREATMENT DATE:  11/28/23 Recumbent Bike L2x55min Seated tibiofemoral mobs PA for extension STM to L hamstrings in prone Supine knee extension stretch with OP Prone x 2 min 3lb weight  Double leg leg press 45# 2x10 Leg curls 20lb 2x10 BLE Leg ext 15lb 2x10 BLE Runner stretch with tibia mobilization to improve extension Game Ready x 10 min L knee med compression 34 deg   11/25/23 Supine feet on pball knee flexion 2x10, with towel for increased knee flexion 2x10 Supine bridge knees bent on pball 2x10 Supine bridge knees straight on pball 2x10 Prone knee hang with 2# weight x2 min Prone knee hang with quad set 10x3", with PT MWM for knee extension 2x10  Supine quad set with PT overpressure 2x10 Recumbent bike seat 8 x 3 min, seat 7 x 2 min, seat 6 x 1 min  Double leg leg press 45# 2x10 Single leg leg press 20# x10 Single leg standing, cone tap fwd & to the side 2x10 Staggered stance, heel strike with quad set into stance phase tap fwd/bwd 2x10 Side step with red TB x5 laps by counter Monster walk fwd/bwd red TB x5 laps by counter Game Ready x 10 min L knee low compression 34 deg    11/18/23 Seated hamstring stretch 2x30" Prone knee hang 3x 1 min Prone knee hang with  PNF contract/relax knee flex/ext 10x3" Prone  hamstring curl 3# 2x10 Prone hip ext with knee flexed 2x10 Manual therapy: IASTM L hamstring, grade II to III tibiofemoral mobs for ext Standing hip abd 3# 2x10 Standing hip ext 3# 2x10 Standing gastroc stretch x30" Counter squat touch to chair 2x10 Seated knee flexion self stretch 3x30" Recumbent bike seat 8; 2 min forward (focus on keeping ankle DF), 2 min bwd Game Ready x 15 min L knee low compression 34 deg   11/11/23 Nustep L5x79min Prone knee hang x1 min Roller to L hamstring, STM & TPR L hamstring Grade II to III tibiofemoral mobs for ext Prone PNF flex/ext 10x5" Prone quad set 2x10 Prone hip ext 2x10 Prone knee flex with strap 2x30" Prone hamstring curl at end range 10x3" Recumbent bike x 5 min partial revolutions seat 9 into full revolutions Wall squat 2x10 Terminal knee ext ball against wall 2x10 Game Ready x 10 min L knee low compression 34 deg   11/07/23 Nustep L5x76min  Bike x 4 min partial revolutions seat 7  Prone TKE 10x5" Prone quad stretch with strap 2x30" Prone knee extension hang x 2 min Seated gastroc + HS stretch with strap 2x30" Tibia on femur mobs for extension grade III-IV Game Ready x 10 min L knee low compression 34 deg  11/04/23 Supine feet on ball, knee flex/ext x20 Supine feet on ball, hamstring setting with knees flexed 2x10x3" 90/90 knees & hips x2 min for knee flexion Grade II to III tibiofemoral knee mob for flexion and then ext Patellar mobs all directions Walk feet back for knee flexion 3x10" (up to 95 deg) Supine feet on ball, knee ext glute set x10 Supine feet on ball, knee ext bridge x10 Supine hamstring stretch with strap x30" Knee ext stretch x2 min Supine hip flexor stretch off EOB 2x30" Hip/calf stretch 2x30" Terminal knee ext red TB 2x10 Sit<>stand 2x10  Amb with and without cane x 2 laps Game Ready x 10 min L knee low compression 34 deg    10/30/23 Therapeutic Exercise: Nustep L5 x 6 min Ues/LEs Seated hamstring stretch  2x30" Seated gastroc stretch with strap x30" Seated adductor stretch 2x30" Supine clamshell red TB 2x10 Supine knee flexion self stretch walking feet  Towel under foot, quad set x10 (15 deg from full ext)  Neuro Re-ed: Supine SAQ + ball squeeze 2x10 Supine SAQ + hip abd against red TB 2x10 Prolonged knee ext with foot on rolled towel 2x1 min SLR with strap assist 2x10 Standing L<>R weight shift with quad set   Manual Therapy: Grade II tibiofemoral joint mobs for knee flexion  Vaso:  Low compression, 10 min, 34 deg around R knee   10/25/23 Therapeutic Exercise: to improve strength, ROM, flexibility, and endurance  Seated heel slides AROM x 10; AAROM with strap x 10 Seated L LAQ x 10 Seated hamstring stretch with strap 2x30" Supine QS with towel under knee x 20 Supine L SLR x 10- decreased TKE but no quad lag Standing heel raises 2x10 Standing L hip extension 2x10  Rec bike x 3 min partial rev GAIT TRAINING: To normalize gait pattern and improve safety. SPC supervision- L hip circumduction on swing phase otherwise stable with cane 180 ft Game Ready x 10 min L knee low compression 34 deg   10/25/23 Therapeutic Exercise: to improve strength, ROM, flexibility, and endurance  Nustep L3x41min UE/LE Seated LAQ 2x5 Seated heel slide x 10 Quad sets 10x5" Thomas stretch 1 min 2x Standing  lumbar extension w/ walker x 10  Gait Training: With walker supervision 360 ft- cues for heel strike, increased knee flexion, increased hip extension, upright posture   Game Ready x 10 min L knee low compression 34 deg    PATIENT EDUCATION:  Education details: HEP update Person educated: Patient Education method: Explanation, Demonstration, and Handouts Education comprehension: verbalized understanding, returned demonstration, and needs further education  HOME EXERCISE PROGRAM: Access Code: [email or web address] URL: [email or web address] Date: 11/07/2023 Prepared by: Noelani Harbach  Exercises - Modified Thomas Stretch  - 1 x daily - 7 x weekly - 3 sets - 3 reps - 1 min  hold - Standing Lumbar Extension  - 1 x daily - 7 x weekly - 3 sets - 3 reps - Seated Hamstring Stretch with Strap  - 1 x daily - 7 x weekly - 2 sets - 2 reps - 30 sec hold - Heel Raises with Counter Support  - 1 x daily - 7 x weekly - 2 sets - 10 reps - Hooklying Isometric Clamshell  - 1 x daily - 7 x weekly - 2 sets - 10 reps - Gastroc Stretch on Wall  - 1 x daily - 7 x weekly - 2 sets - 30 sec hold - Standing Terminal Knee Extension with Resistance  - 1 x daily - 7 x weekly - 2 sets - 10 reps - 3 sec hold - Supine Knee Extension Stretch on Towel Roll  - 1 x daily - 7 x weekly - 2 sets - 30 sec hold - Long Sitting Quad Set with Towel Roll Under Heel  - 1 x daily - 7 x weekly - 2 sets - 10 reps - 3 sec hold - Prone Terminal Knee Extension  - 1 x daily - 7 x weekly - 2 sets - 10 reps - Prone Quadriceps Stretch with Strap  - 1 x daily - 7 x weekly - 2 sets - 2 reps - 30 sec hold - Prone Knee Extension Hang  - 1 x daily - 7 x weekly - 1 sets - 1 reps - 2 min hold  ASSESSMENT:  CLINICAL IMPRESSION: Pt still remains limited with ROM by her edema. We did work more on joint mobs and stretching for knee extension  Continued strengthening exercises to tolerance. She does need continual mobilization and posterior knee stretching to achieve full knee extension.   From eval: Patient is a 48 y.o. F who was seen today for physical therapy evaluation and treatment for L TKR on 10/17/23. PMH significant for prior L knee surgery (did not have full range) as well as L side sciatica. Assessment significant for decreased L knee ROM, poor quad activation, edema and L>R LE weakness leading to decreased balance, gait dysfunction, and transfers affecting home and community mobility. Pt will benefit from PT to address these deficits for return to her baseline function.   OBJECTIVE IMPAIRMENTS: Abnormal gait, decreased  activity tolerance, decreased balance, decreased endurance, decreased mobility, difficulty walking, decreased ROM, decreased strength, hypomobility, increased edema, increased fascial restrictions, increased muscle spasms, impaired flexibility, impaired sensation, postural dysfunction, and pain.     GOALS: Goals reviewed with patient? Yes   SHORT TERM GOALS: Target date: 11/18/2023   Independent with initial HEP. Baseline: Newly issued on eval Goal status: MET- 10/28/23  2.  Pt will have improved L knee ROM from to 10-90 deg Baseline: 25-80 PROM, 40-75 AROM Goal status: MET- 11/07/23  3.  Pt will be able to wean to  SPC for home mobility utilizing reciprocal gait pattern Baseline: RW utilized with early step through pattern Goal status: MET - 11/18/23   LONG TERM GOALS: Target date: 12/16/2023  Independent with advanced/ongoing HEP to improve outcomes and carryover.  Baseline:  Goal status: INITIAL  2.  Gabriela Bean will demonstrate L knee flexion to 110 deg to ascend/descend stairs. Baseline: 80 deg AROM Goal status: INITIAL  3.  Gabriela Bean will demonstrate L knee extension = to R knee for safety with gait. Baseline: R knee = 5 deg from full ext, L knee = 25 deg from full ext Goal status: INITIAL  4.  Gabriela Bean will be able to ambulate 1000' ind and normal gait pattern to access community/work.  Baseline: currently using RW Goal status: INITIAL  5.  Gabriela Bean will be able to ascend/descend stairs with 1 HR and reciprocal step pattern safely to access home and community.  Baseline: not performing stairs Goal status: INITIAL  6.  Gabriela Bean will demonstrate > 19/24 on DGI to demonstrate decreased risk of falls.   Baseline: 12/24 Goal status: INITIAL  7.  Gabriela Bean will demonstrate improved functional LE strength by completing 5x STS in <10 seconds.  (MCID 5 seconds) Baseline: 15.79 sec Goal status: INITIAL  8.  Gabriela Bean will have improved LEFS to  >/=15/80 to demo MCID Baseline: 6/80 Goal status: INITIAL   PLAN:  PT FREQUENCY: 2x/week  PT DURATION: 8 weeks  PLANNED INTERVENTIONS: 97164- PT Re-evaluation, 97110-Therapeutic exercises, 97530- Therapeutic activity, 97112- Neuromuscular re-education, 97535- Self Care, 81191- Manual therapy, [phone]- Gait training, [phone]- Aquatic Therapy, [phone]- Electrical stimulation (unattended), 97035- Ultrasound, 84696- Ionotophoresis 4mg /ml Dexamethasone, Patient/Family education, Balance training, Stair training, Taping, Dry Needling, Joint mobilization, Spinal mobilization, Cryotherapy, and Moist heat  PLAN FOR NEXT SESSION: Work on quad firing, knee ROM, and general L LE strengthening. Gait training   Gabriela Bean L Gabriela Bean, PTA 11/28/2023, 10:21 AM

## 2023-12-02 ENCOUNTER — Ambulatory Visit: Admitting: Physical Therapy

## 2023-12-02 DIAGNOSIS — M6281 Muscle weakness (generalized): Secondary | ICD-10-CM

## 2023-12-02 DIAGNOSIS — M25662 Stiffness of left knee, not elsewhere classified: Secondary | ICD-10-CM

## 2023-12-02 DIAGNOSIS — M25562 Pain in left knee: Secondary | ICD-10-CM | POA: Diagnosis not present

## 2023-12-02 DIAGNOSIS — G8929 Other chronic pain: Secondary | ICD-10-CM

## 2023-12-02 DIAGNOSIS — R262 Difficulty in walking, not elsewhere classified: Secondary | ICD-10-CM

## 2023-12-02 NOTE — Therapy (Signed)
 OUTPATIENT PHYSICAL THERAPY LOWER EXTREMITY TREATMENT   Patient Name: Gabriela Bean MRN: [id number] DOB:1976/05/07, 48 y.o., female Today's Date: 12/02/2023  END OF SESSION:  PT End of Session - 12/02/23 1021     Visit Number 12    Number of Visits 16    Date for PT Re-Evaluation 12/16/23    Authorization Type [employer]    Progress Note Due on Visit 10    PT Start Time 1017    PT Stop Time 1100    PT Time Calculation (min) 43 min    Activity Tolerance Patient tolerated treatment well    Behavior During Therapy WFL for tasks assessed/performed              Past Medical History:  Diagnosis Date   ALLERGIC RHINITIS 01/06/2009   Depression    Family history of BRCA2 gene positive    Family history of breast cancer    Family history of ovarian cancer    Family history of prostate cancer    Infertility, female    VBAC, delivered, current hospitalization 09/01/2012   Past Surgical History:  Procedure Laterality Date   CESAREAN SECTION  03/25/10   KNEE SURGERY Left 2010   torn NCL   Patient Active Problem List   Diagnosis Date Noted   Prediabetes 10/09/2023   Anxiety disorder 04/11/2023   Primary osteoarthritis of both first carpometacarpal joints 08/05/2019   Genetic testing 01/27/2019   Family history of breast cancer    Family history of ovarian cancer    Family history of prostate cancer    Family history of BRCA2 gene positive    Pain in left knee 03/20/2018   ROM (rupture of membranes), premature 09/01/2012   Acute sinus infection 08/30/2012   Dyspnea and respiratory abnormality 08/30/2012   Allergic rhinitis 01/06/2009    PCP: Gavin Kast, FNP  REFERRING PROVIDER: Osa Blase, MD  REFERRING DIAG: [id number] (ICD-10-CM) - S/P total knee replacement  THERAPY DIAG:  Chronic pain of left knee  Stiffness of left knee, not elsewhere classified  Muscle weakness (generalized)  Difficulty in walking, not elsewhere classified  Rationale for Evaluation and  Treatment: Rehabilitation  ONSET DATE: 10/17/23 L TKA  SUBJECTIVE:   SUBJECTIVE STATEMENT:   Nothing new to report.   Eval: Pt reports a lot of swelling and she had a lot of bleeding. Has been icing and getting up every hour. Pt was told not to do all of the take home exercises. Will be seeing ortho this afternoon.   PERTINENT HISTORY: L sciatica PAIN:  Are you having pain? Yes: NPRS scale: 3/10 currently Pain location: anterior knee and back of thigh Pain description: dull aching in thigh, sharpness in knee Aggravating factors: Walking, transfers Relieving factors: ice, pain medicine  PRECAUTIONS: None  RED FLAGS: None   WEIGHT BEARING RESTRICTIONS: No  FALLS:  Has patient fallen in last 6 months? No  LIVING ENVIRONMENT: Lives with: lives with their family son and daughter Lives in: House/apartment Stairs: Yes: Internal: 13 steps; on left going up and External: 3 steps; on right going up (staying downstairs for now) Has following equipment at home: Single point cane and Walker - 2 wheeled  OCCUPATION: Therapist -- mostly desk work   PLOF: Independent  PATIENT GOALS: Return to work and normal activities  NEXT MD VISIT: 11/27/23  OBJECTIVE:  Note: Objective measures were completed at Evaluation unless otherwise noted.  DIAGNOSTIC FINDINGS: nothing new in Epic  PATIENT SURVEYS:  LEFS 6/80 = 7.5%  SENSATION: A little numbness on the front  MUSCLE LENGTH: Hamstrings: Left limited due to edema Thomas test: Did not assess  POSTURE: weight shift right and L knee flexed  PALPATION: TTP ant and posterior L thigh, ant knee  LOWER EXTREMITY ROM:  Active ROM Right eval Left eval 10/23/23 11/07/23 11/18/23 11/25/23  Hip flexion        Hip extension        Hip abduction        Hip adduction        Hip internal rotation        Hip external rotation        Knee flexion 115 AROM 75 AROM 80 PROM 83 AROM  105 PROM 110 PROM 105 AROM  Knee extension -5 AROM -40  AROM -25 PROM  6  -6 PROM -10 AROM  Ankle dorsiflexion        Ankle plantarflexion        Ankle inversion        Ankle eversion         (Blank rows = not tested)  LOWER EXTREMITY MMT:  MMT Right eval Left eval  Hip flexion 5 4*  Hip extension    Hip abduction    Hip adduction    Hip internal rotation    Hip external rotation    Knee flexion 5 4  Knee extension 5 2+  Ankle dorsiflexion 5 5  Ankle plantarflexion    Ankle inversion    Ankle eversion     (Blank rows = not tested, * = pain)  LOWER EXTREMITY SPECIAL TESTS:  Did not assess  FUNCTIONAL TESTS:  5 times sit to stand: 15.79 sec DGI: 12/24  GAIT: Distance walked: Into clinic Assistive device utilized: Environmental consultant - 2 wheeled Level of assistance: Modified independence Comments: Antalgic, decreased L knee flexion during swing, decreased extension during stance, early step through pattern                                                                                                                                TREATMENT DATE:  12/02/23 Prone knee hang x2 min Prone knee hang + quad set and PT overpressure Supine quad set with PT overpressure Grade III tibiofemoral mobs for ext Supine SLR with strap + quad set 3x10 Standing foot on aerobic step, into gastroc stretch on L 3x10 with MWM for ext Fwd step up on aerobic step on L focusing on terminal knee extension 3x10 Rockerboard side to side working on knee flexion/ext 30x3" Double leg leg press 55# 2x10, single leg 25# 2x10 Game Ready x 10 min L knee med compression 34 deg   11/28/23 Recumbent Bike L2x71min Seated tibiofemoral mobs PA for extension STM to L hamstrings in prone Supine knee extension stretch with OP Prone x 2 min 3lb weight  Double leg leg press 45# 2x10 Leg curls 20lb 2x10 BLE Leg ext 15lb 2x10 BLE Runner stretch  with tibia mobilization to improve extension Game Ready x 10 min L knee med compression 34 deg   11/25/23 Supine feet on pball  knee flexion 2x10, with towel for increased knee flexion 2x10 Supine bridge knees bent on pball 2x10 Supine bridge knees straight on pball 2x10 Prone knee hang with 2# weight x2 min Prone knee hang with quad set 10x3", with PT MWM for knee extension 2x10  Supine quad set with PT overpressure 2x10 Recumbent bike seat 8 x 3 min, seat 7 x 2 min, seat 6 x 1 min  Double leg leg press 45# 2x10 Single leg leg press 20# x10 Single leg standing, cone tap fwd & to the side 2x10 Staggered stance, heel strike with quad set into stance phase tap fwd/bwd 2x10 Side step with red TB x5 laps by counter Monster walk fwd/bwd red TB x5 laps by counter Game Ready x 10 min L knee low compression 34 deg    11/18/23 Seated hamstring stretch 2x30" Prone knee hang 3x 1 min Prone knee hang with PNF contract/relax knee flex/ext 10x3" Prone hamstring curl 3# 2x10 Prone hip ext with knee flexed 2x10 Manual therapy: IASTM L hamstring, grade II to III tibiofemoral mobs for ext Standing hip abd 3# 2x10 Standing hip ext 3# 2x10 Standing gastroc stretch x30" Counter squat touch to chair 2x10 Seated knee flexion self stretch 3x30" Recumbent bike seat 8; 2 min forward (focus on keeping ankle DF), 2 min bwd Game Ready x 15 min L knee low compression 34 deg   11/11/23 Nustep L5x57min Prone knee hang x1 min Roller to L hamstring, STM & TPR L hamstring Grade II to III tibiofemoral mobs for ext Prone PNF flex/ext 10x5" Prone quad set 2x10 Prone hip ext 2x10 Prone knee flex with strap 2x30" Prone hamstring curl at end range 10x3" Recumbent bike x 5 min partial revolutions seat 9 into full revolutions Wall squat 2x10 Terminal knee ext ball against wall 2x10 Game Ready x 10 min L knee low compression 34 deg   11/07/23 Nustep L5x65min  Bike x 4 min partial revolutions seat 7  Prone TKE 10x5" Prone quad stretch with strap 2x30" Prone knee extension hang x 2 min Seated gastroc + HS stretch with strap 2x30" Tibia on  femur mobs for extension grade III-IV Game Ready x 10 min L knee low compression 34 deg  11/04/23 Supine feet on ball, knee flex/ext x20 Supine feet on ball, hamstring setting with knees flexed 2x10x3" 90/90 knees & hips x2 min for knee flexion Grade II to III tibiofemoral knee mob for flexion and then ext Patellar mobs all directions Walk feet back for knee flexion 3x10" (up to 95 deg) Supine feet on ball, knee ext glute set x10 Supine feet on ball, knee ext bridge x10 Supine hamstring stretch with strap x30" Knee ext stretch x2 min Supine hip flexor stretch off EOB 2x30" Hip/calf stretch 2x30" Terminal knee ext red TB 2x10 Sit<>stand 2x10  Amb with and without cane x 2 laps Game Ready x 10 min L knee low compression 34 deg    10/30/23 Therapeutic Exercise: Nustep L5 x 6 min Ues/LEs Seated hamstring stretch 2x30" Seated gastroc stretch with strap x30" Seated adductor stretch 2x30" Supine clamshell red TB 2x10 Supine knee flexion self stretch walking feet  Towel under foot, quad set x10 (15 deg from full ext)  Neuro Re-ed: Supine SAQ + ball squeeze 2x10 Supine SAQ + hip abd against red TB 2x10 Prolonged knee ext with foot  on rolled towel 2x1 min SLR with strap assist 2x10 Standing L<>R weight shift with quad set   Manual Therapy: Grade II tibiofemoral joint mobs for knee flexion  Vaso:  Low compression, 10 min, 34 deg around R knee   10/25/23 Therapeutic Exercise: to improve strength, ROM, flexibility, and endurance  Seated heel slides AROM x 10; AAROM with strap x 10 Seated L LAQ x 10 Seated hamstring stretch with strap 2x30" Supine QS with towel under knee x 20 Supine L SLR x 10- decreased TKE but no quad lag Standing heel raises 2x10 Standing L hip extension 2x10  Rec bike x 3 min partial rev GAIT TRAINING: To normalize gait pattern and improve safety. SPC supervision- L hip circumduction on swing phase otherwise stable with cane 180 ft Game Ready x 10 min L  knee low compression 34 deg   10/25/23 Therapeutic Exercise: to improve strength, ROM, flexibility, and endurance  Nustep L3x13min UE/LE Seated LAQ 2x5 Seated heel slide x 10 Quad sets 10x5" Thomas stretch 1 min 2x Standing lumbar extension w/ walker x 10  Gait Training: With walker supervision 360 ft- cues for heel strike, increased knee flexion, increased hip extension, upright posture   Game Ready x 10 min L knee low compression 34 deg    PATIENT EDUCATION:  Education details: HEP update Person educated: Patient Education method: Explanation, Demonstration, and Handouts Education comprehension: verbalized understanding, returned demonstration, and needs further education  HOME EXERCISE PROGRAM: Access Code: [email or web address] URL: [email or web address] Date: 11/07/2023 Prepared by: Braylin Clark  Exercises - Modified Thomas Stretch  - 1 x daily - 7 x weekly - 3 sets - 3 reps - 1 min  hold - Standing Lumbar Extension  - 1 x daily - 7 x weekly - 3 sets - 3 reps - Seated Hamstring Stretch with Strap  - 1 x daily - 7 x weekly - 2 sets - 2 reps - 30 sec hold - Heel Raises with Counter Support  - 1 x daily - 7 x weekly - 2 sets - 10 reps - Hooklying Isometric Clamshell  - 1 x daily - 7 x weekly - 2 sets - 10 reps - Gastroc Stretch on Wall  - 1 x daily - 7 x weekly - 2 sets - 30 sec hold - Standing Terminal Knee Extension with Resistance  - 1 x daily - 7 x weekly - 2 sets - 10 reps - 3 sec hold - Supine Knee Extension Stretch on Towel Roll  - 1 x daily - 7 x weekly - 2 sets - 30 sec hold - Long Sitting Quad Set with Towel Roll Under Heel  - 1 x daily - 7 x weekly - 2 sets - 10 reps - 3 sec hold - Prone Terminal Knee Extension  - 1 x daily - 7 x weekly - 2 sets - 10 reps - Prone Quadriceps Stretch with Strap  - 1 x daily - 7 x weekly - 2 sets - 2 reps - 30 sec hold - Prone Knee Extension Hang  - 1 x daily - 7 x weekly - 1 sets - 1 reps - 2 min hold  ASSESSMENT:  CLINICAL  IMPRESSION: Focused on improving knee extension and terminal quad firing. Increased manual work and joint mobilizations with and without movement. Discussed with pt our goal to try and achieve 5 deg from full knee extension actively by hopefully the end of the week.    From eval: Patient is a 48 y.o.  F who was seen today for physical therapy evaluation and treatment for L TKR on 10/17/23. PMH significant for prior L knee surgery (did not have full range) as well as L side sciatica. Assessment significant for decreased L knee ROM, poor quad activation, edema and L>R LE weakness leading to decreased balance, gait dysfunction, and transfers affecting home and community mobility. Pt will benefit from PT to address these deficits for return to her baseline function.   OBJECTIVE IMPAIRMENTS: Abnormal gait, decreased activity tolerance, decreased balance, decreased endurance, decreased mobility, difficulty walking, decreased ROM, decreased strength, hypomobility, increased edema, increased fascial restrictions, increased muscle spasms, impaired flexibility, impaired sensation, postural dysfunction, and pain.     GOALS: Goals reviewed with patient? Yes   SHORT TERM GOALS: Target date: 11/18/2023   Independent with initial HEP. Baseline: Newly issued on eval Goal status: MET- 10/28/23  2.  Pt will have improved L knee ROM from to 10-90 deg Baseline: 25-80 PROM, 40-75 AROM Goal status: MET- 11/07/23  3.  Pt will be able to wean to [facility] for home mobility utilizing reciprocal gait pattern Baseline: RW utilized with early step through pattern Goal status: MET - 11/18/23   LONG TERM GOALS: Target date: 12/16/2023  Independent with advanced/ongoing HEP to improve outcomes and carryover.  Baseline:  Goal status: INITIAL  2.  Dewayne Ford will demonstrate L knee flexion to 110 deg to ascend/descend stairs. Baseline: 80 deg AROM Goal status: INITIAL  3.  Dewayne Ford will demonstrate L knee extension =  to R knee for safety with gait. Baseline: R knee = 5 deg from full ext, L knee = 25 deg from full ext Goal status: INITIAL  4.  Sonali Wivell will be able to ambulate 1000' ind and normal gait pattern to access community/work.  Baseline: currently using RW Goal status: INITIAL  5.  Laya Letendre will be able to ascend/descend stairs with 1 HR and reciprocal step pattern safely to access home and community.  Baseline: not performing stairs Goal status: INITIAL  6.  Albirtha Grinage will demonstrate > 19/24 on DGI to demonstrate decreased risk of falls.   Baseline: 12/24 Goal status: INITIAL  7.  Dewayne Ford will demonstrate improved functional LE strength by completing 5x STS in <10 seconds.  (MCID 5 seconds) Baseline: 15.79 sec Goal status: INITIAL  8.  Paije Goodhart will have improved LEFS to >/=15/80 to demo MCID Baseline: 6/80 Goal status: INITIAL   PLAN:  PT FREQUENCY: 2x/week  PT DURATION: 8 weeks  PLANNED INTERVENTIONS: 97164- PT Re-evaluation, 97110-Therapeutic exercises, 97530- Therapeutic activity, 97112- Neuromuscular re-education, 97535- Self Care, 51884- Manual therapy, [phone]- Gait training, [phone]- Aquatic Therapy, [phone]- Electrical stimulation (unattended), 97035- Ultrasound, 35573- Ionotophoresis 4mg /ml Dexamethasone, Patient/Family education, Balance training, Stair training, Taping, Dry Needling, Joint mobilization, Spinal mobilization, Cryotherapy, and Moist heat  PLAN FOR NEXT SESSION: Knee ROM, L LE strengthening. Gait training   Akim Watkinson April Ma L Sharetta Ricchio, PT 12/02/2023, 10:52 AM

## 2023-12-05 ENCOUNTER — Ambulatory Visit

## 2023-12-05 DIAGNOSIS — M6281 Muscle weakness (generalized): Secondary | ICD-10-CM

## 2023-12-05 DIAGNOSIS — M25562 Pain in left knee: Secondary | ICD-10-CM | POA: Diagnosis not present

## 2023-12-05 DIAGNOSIS — R262 Difficulty in walking, not elsewhere classified: Secondary | ICD-10-CM

## 2023-12-05 DIAGNOSIS — G8929 Other chronic pain: Secondary | ICD-10-CM

## 2023-12-05 DIAGNOSIS — M25662 Stiffness of left knee, not elsewhere classified: Secondary | ICD-10-CM

## 2023-12-05 NOTE — Therapy (Signed)
 OUTPATIENT PHYSICAL THERAPY LOWER EXTREMITY TREATMENT   Patient Name: Gabriela Bean MRN: [id number] DOB:[birth year], 48 y.o., female Today's Date: 12/05/2023  END OF SESSION:     Past Medical History:  Diagnosis Date   ALLERGIC RHINITIS 01/06/2009   Depression    Family history of BRCA2 gene positive    Family history of breast cancer    Family history of ovarian cancer    Family history of prostate cancer    Infertility, female    VBAC, delivered, current hospitalization 09/01/2012   Past Surgical History:  Procedure Laterality Date   CESAREAN SECTION  03/25/10   KNEE SURGERY Left 2010   torn NCL   Patient Active Problem List   Diagnosis Date Noted   Prediabetes 10/09/2023   Anxiety disorder 04/11/2023   Primary osteoarthritis of both first carpometacarpal joints 08/05/2019   Genetic testing 01/27/2019   Family history of breast cancer    Family history of ovarian cancer    Family history of prostate cancer    Family history of BRCA2 gene positive    Pain in left knee 03/20/2018   ROM (rupture of membranes), premature 09/01/2012   Acute sinus infection 08/30/2012   Dyspnea and respiratory abnormality 08/30/2012   Allergic rhinitis 01/06/2009    PCP: Gavin Kast, FNP  REFERRING PROVIDER: Osa Blase, MD  REFERRING DIAG: [phone] (ICD-10-CM) - S/P total knee replacement  THERAPY DIAG:  Chronic pain of left knee  Stiffness of left knee, not elsewhere classified  Muscle weakness (generalized)  Difficulty in walking, not elsewhere classified  Rationale for Evaluation and Treatment: Rehabilitation  ONSET DATE: 10/17/23 L TKA  SUBJECTIVE:   SUBJECTIVE STATEMENT:   Hurt a lot last night but fine right now.  Eval: Pt reports a lot of swelling and she had a lot of bleeding. Has been icing and getting up every hour. Pt was told not to do all of the take home exercises. Will be seeing ortho this afternoon.   PERTINENT HISTORY: L sciatica PAIN:  Are you  having pain? Yes: NPRS scale: 2/10 currently Pain location: anterior knee and back of thigh Pain description: dull aching in thigh, sharpness in knee Aggravating factors: Walking, transfers Relieving factors: ice, pain medicine  PRECAUTIONS: None  RED FLAGS: None   WEIGHT BEARING RESTRICTIONS: No  FALLS:  Has patient fallen in last 6 months? No  LIVING ENVIRONMENT: Lives with: lives with their family son and daughter Lives in: House/apartment Stairs: Yes: Internal: 13 steps; on left going up and External: 3 steps; on right going up (staying downstairs for now) Has following equipment at home: Single point cane and Walker - 2 wheeled  OCCUPATION: Therapist -- mostly desk work   PLOF: Independent  PATIENT GOALS: Return to work and normal activities  NEXT MD VISIT: 11/27/23  OBJECTIVE:  Note: Objective measures were completed at Evaluation unless otherwise noted.  DIAGNOSTIC FINDINGS: nothing new in Epic  PATIENT SURVEYS:  LEFS 6/80 = 7.5%  SENSATION: A little numbness on the front  MUSCLE LENGTH: Hamstrings: Left limited due to edema Thomas test: Did not assess  POSTURE: weight shift right and L knee flexed  PALPATION: TTP ant and posterior L thigh, ant knee  LOWER EXTREMITY ROM:  Active ROM Right eval Left eval 10/23/23 11/07/23 11/18/23 11/25/23 12/05/23  Hip flexion         Hip extension         Hip abduction         Hip adduction  Hip internal rotation         Hip external rotation         Knee flexion 115 AROM 75 AROM 80 PROM 83 AROM  105 PROM 110 PROM 105 AROM   Knee extension -5 AROM -40 AROM -25 PROM  6  -6 PROM -10 AROM 8 AROM  Ankle dorsiflexion         Ankle plantarflexion         Ankle inversion         Ankle eversion          (Blank rows = not tested)  LOWER EXTREMITY MMT:  MMT Right eval Left eval  Hip flexion 5 4*  Hip extension    Hip abduction    Hip adduction    Hip internal rotation    Hip external rotation    Knee  flexion 5 4  Knee extension 5 2+  Ankle dorsiflexion 5 5  Ankle plantarflexion    Ankle inversion    Ankle eversion     (Blank rows = not tested, * = pain)  LOWER EXTREMITY SPECIAL TESTS:  Did not assess  FUNCTIONAL TESTS:  5 times sit to stand: 15.79 sec DGI: 12/24  GAIT: Distance walked: Into clinic Assistive device utilized: Environmental consultant - 2 wheeled Level of assistance: Modified independence Comments: Antalgic, decreased L knee flexion during swing, decreased extension during stance, early step through pattern                                                                                                                                TREATMENT DATE:  12/05/23 Bike L3x65min; x 2 min 3 notches back to extend her knee Prone knee hang + quad set and PT overpressure Knee extension mobilizations in sitting, supine, and prone tibia on femur to improve extension Thomas stretch x 1 min SAQ assisted with TKE x 10 Prone hang stretch w/ moist heat x 12/02/23 Prone knee hang x2 min Prone knee hang + quad set and PT overpressure Supine quad set with PT overpressure Grade III tibiofemoral mobs for ext Supine SLR with strap + quad set 3x10 Standing foot on aerobic step, into gastroc stretch on L 3x10 with MWM for ext Fwd step up on aerobic step on L focusing on terminal knee extension 3x10 Rockerboard side to side working on knee flexion/ext 30x3" Double leg leg press 55# 2x10, single leg 25# 2x10 Game Ready x 10 min L knee med compression 34 deg   11/28/23 Recumbent Bike L2x20min Seated tibiofemoral mobs PA for extension STM to L hamstrings in prone Supine knee extension stretch with OP Prone x 2 min 3lb weight  Double leg leg press 45# 2x10 Leg curls 20lb 2x10 BLE Leg ext 15lb 2x10 BLE Runner stretch with tibia mobilization to improve extension Game Ready x 10 min L knee med compression 34 deg   11/25/23 Supine feet on pball knee flexion 2x10, with  towel for increased knee  flexion 2x10 Supine bridge knees bent on pball 2x10 Supine bridge knees straight on pball 2x10 Prone knee hang with 2# weight x2 min Prone knee hang with quad set 10x3", with PT MWM for knee extension 2x10  Supine quad set with PT overpressure 2x10 Recumbent bike seat 8 x 3 min, seat 7 x 2 min, seat 6 x 1 min  Double leg leg press 45# 2x10 Single leg leg press 20# x10 Single leg standing, cone tap fwd & to the side 2x10 Staggered stance, heel strike with quad set into stance phase tap fwd/bwd 2x10 Side step with red TB x5 laps by counter Monster walk fwd/bwd red TB x5 laps by counter Game Ready x 10 min L knee low compression 34 deg    11/18/23 Seated hamstring stretch 2x30" Prone knee hang 3x 1 min Prone knee hang with PNF contract/relax knee flex/ext 10x3" Prone hamstring curl 3# 2x10 Prone hip ext with knee flexed 2x10 Manual therapy: IASTM L hamstring, grade II to III tibiofemoral mobs for ext Standing hip abd 3# 2x10 Standing hip ext 3# 2x10 Standing gastroc stretch x30" Counter squat touch to chair 2x10 Seated knee flexion self stretch 3x30" Recumbent bike seat 8; 2 min forward (focus on keeping ankle DF), 2 min bwd Game Ready x 15 min L knee low compression 34 deg   11/11/23 Nustep L5x42min Prone knee hang x1 min Roller to L hamstring, STM & TPR L hamstring Grade II to III tibiofemoral mobs for ext Prone PNF flex/ext 10x5" Prone quad set 2x10 Prone hip ext 2x10 Prone knee flex with strap 2x30" Prone hamstring curl at end range 10x3" Recumbent bike x 5 min partial revolutions seat 9 into full revolutions Wall squat 2x10 Terminal knee ext ball against wall 2x10 Game Ready x 10 min L knee low compression 34 deg   11/07/23 Nustep L5x23min  Bike x 4 min partial revolutions seat 7  Prone TKE 10x5" Prone quad stretch with strap 2x30" Prone knee extension hang x 2 min Seated gastroc + HS stretch with strap 2x30" Tibia on femur mobs for extension grade III-IV Game Ready  x 10 min L knee low compression 34 deg  11/04/23 Supine feet on ball, knee flex/ext x20 Supine feet on ball, hamstring setting with knees flexed 2x10x3" 90/90 knees & hips x2 min for knee flexion Grade II to III tibiofemoral knee mob for flexion and then ext Patellar mobs all directions Walk feet back for knee flexion 3x10" (up to 95 deg) Supine feet on ball, knee ext glute set x10 Supine feet on ball, knee ext bridge x10 Supine hamstring stretch with strap x30" Knee ext stretch x2 min Supine hip flexor stretch off EOB 2x30" Hip/calf stretch 2x30" Terminal knee ext red TB 2x10 Sit<>stand 2x10  Amb with and without cane x 2 laps Game Ready x 10 min L knee low compression 34 deg    10/30/23 Therapeutic Exercise: Nustep L5 x 6 min Ues/LEs Seated hamstring stretch 2x30" Seated gastroc stretch with strap x30" Seated adductor stretch 2x30" Supine clamshell red TB 2x10 Supine knee flexion self stretch walking feet  Towel under foot, quad set x10 (15 deg from full ext)  Neuro Re-ed: Supine SAQ + ball squeeze 2x10 Supine SAQ + hip abd against red TB 2x10 Prolonged knee ext with foot on rolled towel 2x1 min SLR with strap assist 2x10 Standing L<>R weight shift with quad set   Manual Therapy: Grade II tibiofemoral joint mobs for knee  flexion  Vaso:  Low compression, 10 min, 34 deg around R knee   10/25/23 Therapeutic Exercise: to improve strength, ROM, flexibility, and endurance  Seated heel slides AROM x 10; AAROM with strap x 10 Seated L LAQ x 10 Seated hamstring stretch with strap 2x30" Supine QS with towel under knee x 20 Supine L SLR x 10- decreased TKE but no quad lag Standing heel raises 2x10 Standing L hip extension 2x10  Rec bike x 3 min partial rev GAIT TRAINING: To normalize gait pattern and improve safety. SPC supervision- L hip circumduction on swing phase otherwise stable with cane 180 ft Game Ready x 10 min L knee low compression 34 deg    10/25/23 Therapeutic Exercise: to improve strength, ROM, flexibility, and endurance  Nustep L3x35min UE/LE Seated LAQ 2x5 Seated heel slide x 10 Quad sets 10x5" Thomas stretch 1 min 2x Standing lumbar extension w/ walker x 10  Gait Training: With walker supervision 360 ft- cues for heel strike, increased knee flexion, increased hip extension, upright posture   Game Ready x 10 min L knee low compression 34 deg    PATIENT EDUCATION:  Education details: HEP update Person educated: Patient Education method: Explanation, Demonstration, and Handouts Education comprehension: verbalized understanding, returned demonstration, and needs further education  HOME EXERCISE PROGRAM: Access Code: [email or web address] URL: [email or web address] Date: 11/07/2023 Prepared by: Evetta Renner  Exercises - Modified Thomas Stretch  - 1 x daily - 7 x weekly - 3 sets - 3 reps - 1 min  hold - Standing Lumbar Extension  - 1 x daily - 7 x weekly - 3 sets - 3 reps - Seated Hamstring Stretch with Strap  - 1 x daily - 7 x weekly - 2 sets - 2 reps - 30 sec hold - Heel Raises with Counter Support  - 1 x daily - 7 x weekly - 2 sets - 10 reps - Hooklying Isometric Clamshell  - 1 x daily - 7 x weekly - 2 sets - 10 reps - Gastroc Stretch on Wall  - 1 x daily - 7 x weekly - 2 sets - 30 sec hold - Standing Terminal Knee Extension with Resistance  - 1 x daily - 7 x weekly - 2 sets - 10 reps - 3 sec hold - Supine Knee Extension Stretch on Towel Roll  - 1 x daily - 7 x weekly - 2 sets - 30 sec hold - Long Sitting Quad Set with Towel Roll Under Heel  - 1 x daily - 7 x weekly - 2 sets - 10 reps - 3 sec hold - Prone Terminal Knee Extension  - 1 x daily - 7 x weekly - 2 sets - 10 reps - Prone Quadriceps Stretch with Strap  - 1 x daily - 7 x weekly - 2 sets - 2 reps - 30 sec hold - Prone Knee Extension Hang  - 1 x daily - 7 x weekly - 1 sets - 1 reps - 2 min hold  ASSESSMENT:  CLINICAL IMPRESSION: Focused on  improving knee extension and terminal quad firing. Increased manual work and joint mobilizations with and without movement. Achieved 8 deg on extension after efforts today. Will continue to try to improve knee extension.    From eval: Patient is a 48 y.o. F who was seen today for physical therapy evaluation and treatment for L TKR on 10/17/23. PMH significant for prior L knee surgery (did not have full range) as well as L side sciatica. Assessment  significant for decreased L knee ROM, poor quad activation, edema and L>R LE weakness leading to decreased balance, gait dysfunction, and transfers affecting home and community mobility. Pt will benefit from PT to address these deficits for return to her baseline function.   OBJECTIVE IMPAIRMENTS: Abnormal gait, decreased activity tolerance, decreased balance, decreased endurance, decreased mobility, difficulty walking, decreased ROM, decreased strength, hypomobility, increased edema, increased fascial restrictions, increased muscle spasms, impaired flexibility, impaired sensation, postural dysfunction, and pain.     GOALS: Goals reviewed with patient? Yes   SHORT TERM GOALS: Target date: 11/18/2023   Independent with initial HEP. Baseline: Newly issued on eval Goal status: MET- 10/28/23  2.  Pt will have improved L knee ROM from to 10-90 deg Baseline: 25-80 PROM, 40-75 AROM Goal status: MET- 11/07/23  3.  Pt will be able to wean to [facility] for home mobility utilizing reciprocal gait pattern Baseline: RW utilized with early step through pattern Goal status: MET - 11/18/23   LONG TERM GOALS: Target date: 12/16/2023  Independent with advanced/ongoing HEP to improve outcomes and carryover.  Baseline:  Goal status: INITIAL  2.  Dewayne Ford will demonstrate L knee flexion to 110 deg to ascend/descend stairs. Baseline: 80 deg AROM Goal status: INITIAL  3.  Dewayne Ford will demonstrate L knee extension = to R knee for safety with gait. Baseline: R  knee = 5 deg from full ext, L knee = 25 deg from full ext Goal status: INITIAL  4.  Lekesha Claw will be able to ambulate 1000' ind and normal gait pattern to access community/work.  Baseline: currently using RW Goal status: INITIAL  5.  Sherin Murdoch will be able to ascend/descend stairs with 1 HR and reciprocal step pattern safely to access home and community.  Baseline: not performing stairs Goal status: INITIAL  6.  Samuel Rittenhouse will demonstrate > 19/24 on DGI to demonstrate decreased risk of falls.   Baseline: 12/24 Goal status: INITIAL  7.  Dewayne Ford will demonstrate improved functional LE strength by completing 5x STS in <10 seconds.  (MCID 5 seconds) Baseline: 15.79 sec Goal status: INITIAL  8.  Shanaya Schneck will have improved LEFS to >/=15/80 to demo MCID Baseline: 6/80 Goal status: INITIAL   PLAN:  PT FREQUENCY: 2x/week  PT DURATION: 8 weeks  PLANNED INTERVENTIONS: 97164- PT Re-evaluation, 97110-Therapeutic exercises, 97530- Therapeutic activity, 97112- Neuromuscular re-education, 97535- Self Care, 13244- Manual therapy, [phone]- Gait training, [phone]- Aquatic Therapy, [phone]- Electrical stimulation (unattended), 97035- Ultrasound, [id number]- Ionotophoresis 4mg /ml Dexamethasone, Patient/Family education, Balance training, Stair training, Taping, Dry Needling, Joint mobilization, Spinal mobilization, Cryotherapy, and Moist heat  PLAN FOR NEXT SESSION: Knee ROM, L LE strengthening. Gait training   Aylen Stradford L Owin Vignola, PTA 12/05/2023, 10:24 AM

## 2023-12-10 ENCOUNTER — Other Ambulatory Visit: Payer: Self-pay

## 2023-12-10 ENCOUNTER — Ambulatory Visit

## 2023-12-10 DIAGNOSIS — G8929 Other chronic pain: Secondary | ICD-10-CM

## 2023-12-10 DIAGNOSIS — M25562 Pain in left knee: Secondary | ICD-10-CM | POA: Diagnosis not present

## 2023-12-10 DIAGNOSIS — M25662 Stiffness of left knee, not elsewhere classified: Secondary | ICD-10-CM

## 2023-12-10 DIAGNOSIS — R262 Difficulty in walking, not elsewhere classified: Secondary | ICD-10-CM

## 2023-12-10 DIAGNOSIS — M6281 Muscle weakness (generalized): Secondary | ICD-10-CM

## 2023-12-10 NOTE — Therapy (Signed)
 OUTPATIENT PHYSICAL THERAPY LOWER EXTREMITY TREATMENT   Patient Name: Gabriela Bean MRN: [id number] DOB:01/04/1976, 48 y.o., female Today's Date: 12/10/2023  END OF SESSION:  PT End of Session - 12/10/23 1025     Visit Number 14    Number of Visits 16    Date for PT Re-Evaluation 12/16/23    Authorization Type [employer]    Progress Note Due on Visit 10    PT Start Time 1020    PT Stop Time 1110    PT Time Calculation (min) 50 min    Activity Tolerance Patient tolerated treatment well    Behavior During Therapy WFL for tasks assessed/performed               Past Medical History:  Diagnosis Date   ALLERGIC RHINITIS 01/06/2009   Depression    Family history of BRCA2 gene positive    Family history of breast cancer    Family history of ovarian cancer    Family history of prostate cancer    Infertility, female    VBAC, delivered, current hospitalization 09/01/2012   Past Surgical History:  Procedure Laterality Date   CESAREAN SECTION  03/25/10   KNEE SURGERY Left 2010   torn NCL   Patient Active Problem List   Diagnosis Date Noted   Prediabetes 10/09/2023   Anxiety disorder 04/11/2023   Primary osteoarthritis of both first carpometacarpal joints 08/05/2019   Genetic testing 01/27/2019   Family history of breast cancer    Family history of ovarian cancer    Family history of prostate cancer    Family history of BRCA2 gene positive    Pain in left knee 03/20/2018   ROM (rupture of membranes), premature 09/01/2012   Acute sinus infection 08/30/2012   Dyspnea and respiratory abnormality 08/30/2012   Allergic rhinitis 01/06/2009    PCP: Gavin Kast, FNP  REFERRING PROVIDER: Osa Blase, MD  REFERRING DIAG: [phone] (ICD-10-CM) - S/P total knee replacement  THERAPY DIAG:  Chronic pain of left knee  Stiffness of left knee, not elsewhere classified  Difficulty in walking, not elsewhere classified  Muscle weakness (generalized)  Rationale for Evaluation and  Treatment: Rehabilitation  ONSET DATE: 10/17/23 L TKA  SUBJECTIVE:   SUBJECTIVE STATEMENT:   Deep ache at rest, hurts if more active,    Eval: Pt reports a lot of swelling and she had a lot of bleeding. Has been icing and getting up every hour. Pt was told not to do all of the take home exercises. Will be seeing ortho this afternoon.   PERTINENT HISTORY: L sciatica PAIN:  Are you having pain? Yes: NPRS scale: 2/10 currently Pain location: anterior knee and back of thigh Pain description: dull aching in thigh, sharpness in knee Aggravating factors: Walking, transfers Relieving factors: ice, pain medicine  PRECAUTIONS: None  RED FLAGS: None   WEIGHT BEARING RESTRICTIONS: No  FALLS:  Has patient fallen in last 6 months? No  LIVING ENVIRONMENT: Lives with: lives with their family son and daughter Lives in: House/apartment Stairs: Yes: Internal: 13 steps; on left going up and External: 3 steps; on right going up (staying downstairs for now) Has following equipment at home: Single point cane and Walker - 2 wheeled  OCCUPATION: Therapist -- mostly desk work   PLOF: Independent  PATIENT GOALS: Return to work and normal activities  NEXT MD VISIT: 11/27/23  OBJECTIVE:  Note: Objective measures were completed at Evaluation unless otherwise noted.  DIAGNOSTIC FINDINGS: nothing new in Epic  PATIENT SURVEYS:  LEFS 6/80 = 7.5%  SENSATION: A little numbness on the front  MUSCLE LENGTH: Hamstrings: Left limited due to edema Thomas test: Did not assess  POSTURE: weight shift right and L knee flexed  PALPATION: TTP ant and posterior L thigh, ant knee  LOWER EXTREMITY ROM:  Active ROM Right eval Left eval 10/23/23 11/07/23 11/18/23 11/25/23 12/05/23  Hip flexion         Hip extension         Hip abduction         Hip adduction         Hip internal rotation         Hip external rotation         Knee flexion 115 AROM 75 AROM 80 PROM 83 AROM  105 PROM 110 PROM 105 AROM    Knee extension -5 AROM -40 AROM -25 PROM  6  -6 PROM -10 AROM 8 AROM  Ankle dorsiflexion         Ankle plantarflexion         Ankle inversion         Ankle eversion          (Blank rows = not tested)  LOWER EXTREMITY MMT:  MMT Right eval Left eval  Hip flexion 5 4*  Hip extension    Hip abduction    Hip adduction    Hip internal rotation    Hip external rotation    Knee flexion 5 4  Knee extension 5 2+  Ankle dorsiflexion 5 5  Ankle plantarflexion    Ankle inversion    Ankle eversion     (Blank rows = not tested, * = pain)  LOWER EXTREMITY SPECIAL TESTS:  Did not assess  FUNCTIONAL TESTS:  5 times sit to stand: 15.79 sec DGI: 12/24  GAIT: Distance walked: Into clinic Assistive device utilized: Environmental consultant - 2 wheeled Level of assistance: Modified independence Comments: Antalgic, decreased L knee flexion during swing, decreased extension during stance, early step through pattern                                                                                                                                TREATMENT DATE:  12/10/23: Bike level 3 x 5 min Supine for initial measurement of L knee ext -20 AP glides L tibia med to lat direction, 4 sets 30 sec  Prone with ankles over foam roller for myofascial release L post knee capsule with blade Prone TKE's with ankles over foam roller, 5 sec holds, 10 reps Supine for therapist providing manual overpressure for L hamstring stretch with overpressure AP for post capsular stretch, pt utilizing strap to provide stretch Instructed in prolonged passive post L capsule stretch while in sitting with L hip above knee, knee above ankle , to utilized 15 to 30 min intervals Instructed in door frame hamstring stretch from supine position with pt able to provide overpressure L ant knee  at intervals Post leans on wall for heel rocks B to engage terminal quads   Game ready at end of session 10 min 34degrees with L Le  elevated  12/05/23 Bike L3x71min; x 2 min 3 notches back to extend her knee Prone knee hang + quad set and PT overpressure Knee extension mobilizations in sitting, supine, and prone tibia on femur to improve extension Thomas stretch x 1 min SAQ assisted with TKE x 10 Prone hang stretch w/ moist heat x 12/02/23 Prone knee hang x2 min Prone knee hang + quad set and PT overpressure Supine quad set with PT overpressure Grade III tibiofemoral mobs for ext Supine SLR with strap + quad set 3x10 Standing foot on aerobic step, into gastroc stretch on L 3x10 with MWM for ext Fwd step up on aerobic step on L focusing on terminal knee extension 3x10 Rockerboard side to side working on knee flexion/ext 30x3" Double leg leg press 55# 2x10, single leg 25# 2x10 Game Ready x 10 min L knee med compression 34 deg   11/28/23 Recumbent Bike L2x43min Seated tibiofemoral mobs PA for extension STM to L hamstrings in prone Supine knee extension stretch with OP Prone x 2 min 3lb weight  Double leg leg press 45# 2x10 Leg curls 20lb 2x10 BLE Leg ext 15lb 2x10 BLE Runner stretch with tibia mobilization to improve extension Game Ready x 10 min L knee med compression 34 deg   11/25/23 Supine feet on pball knee flexion 2x10, with towel for increased knee flexion 2x10 Supine bridge knees bent on pball 2x10 Supine bridge knees straight on pball 2x10 Prone knee hang with 2# weight x2 min Prone knee hang with quad set 10x3", with PT MWM for knee extension 2x10  Supine quad set with PT overpressure 2x10 Recumbent bike seat 8 x 3 min, seat 7 x 2 min, seat 6 x 1 min  Double leg leg press 45# 2x10 Single leg leg press 20# x10 Single leg standing, cone tap fwd & to the side 2x10 Staggered stance, heel strike with quad set into stance phase tap fwd/bwd 2x10 Side step with red TB x5 laps by counter Monster walk fwd/bwd red TB x5 laps by counter Game Ready x 10 min L knee low compression 34 deg     11/18/23 Seated hamstring stretch 2x30" Prone knee hang 3x 1 min Prone knee hang with PNF contract/relax knee flex/ext 10x3" Prone hamstring curl 3# 2x10 Prone hip ext with knee flexed 2x10 Manual therapy: IASTM L hamstring, grade II to III tibiofemoral mobs for ext Standing hip abd 3# 2x10 Standing hip ext 3# 2x10 Standing gastroc stretch x30" Counter squat touch to chair 2x10 Seated knee flexion self stretch 3x30" Recumbent bike seat 8; 2 min forward (focus on keeping ankle DF), 2 min bwd Game Ready x 15 min L knee low compression 34 deg   11/11/23 Nustep L5x47min Prone knee hang x1 min Roller to L hamstring, STM & TPR L hamstring Grade II to III tibiofemoral mobs for ext Prone PNF flex/ext 10x5" Prone quad set 2x10 Prone hip ext 2x10 Prone knee flex with strap 2x30" Prone hamstring curl at end range 10x3" Recumbent bike x 5 min partial revolutions seat 9 into full revolutions Wall squat 2x10 Terminal knee ext ball against wall 2x10 Game Ready x 10 min L knee low compression 34 deg   11/07/23 Nustep L5x27min  Bike x 4 min partial revolutions seat 7  Prone TKE 10x5" Prone quad stretch with strap 2x30"  Prone knee extension hang x 2 min Seated gastroc + HS stretch with strap 2x30" Tibia on femur mobs for extension grade III-IV Game Ready x 10 min L knee low compression 34 deg  11/04/23 Supine feet on ball, knee flex/ext x20 Supine feet on ball, hamstring setting with knees flexed 2x10x3" 90/90 knees & hips x2 min for knee flexion Grade II to III tibiofemoral knee mob for flexion and then ext Patellar mobs all directions Walk feet back for knee flexion 3x10" (up to 95 deg) Supine feet on ball, knee ext glute set x10 Supine feet on ball, knee ext bridge x10 Supine hamstring stretch with strap x30" Knee ext stretch x2 min Supine hip flexor stretch off EOB 2x30" Hip/calf stretch 2x30" Terminal knee ext red TB 2x10 Sit<>stand 2x10  Amb with and without cane x 2  laps Game Ready x 10 min L knee low compression 34 deg    10/30/23 Therapeutic Exercise: Nustep L5 x 6 min Ues/LEs Seated hamstring stretch 2x30" Seated gastroc stretch with strap x30" Seated adductor stretch 2x30" Supine clamshell red TB 2x10 Supine knee flexion self stretch walking feet  Towel under foot, quad set x10 (15 deg from full ext)  Neuro Re-ed: Supine SAQ + ball squeeze 2x10 Supine SAQ + hip abd against red TB 2x10 Prolonged knee ext with foot on rolled towel 2x1 min SLR with strap assist 2x10 Standing L<>R weight shift with quad set   Manual Therapy: Grade II tibiofemoral joint mobs for knee flexion  Vaso:  Low compression, 10 min, 34 deg around R knee   10/25/23 Therapeutic Exercise: to improve strength, ROM, flexibility, and endurance  Seated heel slides AROM x 10; AAROM with strap x 10 Seated L LAQ x 10 Seated hamstring stretch with strap 2x30" Supine QS with towel under knee x 20 Supine L SLR x 10- decreased TKE but no quad lag Standing heel raises 2x10 Standing L hip extension 2x10  Rec bike x 3 min partial rev GAIT TRAINING: To normalize gait pattern and improve safety. SPC supervision- L hip circumduction on swing phase otherwise stable with cane 180 ft Game Ready x 10 min L knee low compression 34 deg   10/25/23 Therapeutic Exercise: to improve strength, ROM, flexibility, and endurance  Nustep L3x67min UE/LE Seated LAQ 2x5 Seated heel slide x 10 Quad sets 10x5" Thomas stretch 1 min 2x Standing lumbar extension w/ walker x 10  Gait Training: With walker supervision 360 ft- cues for heel strike, increased knee flexion, increased hip extension, upright posture   Game Ready x 10 min L knee low compression 34 deg    PATIENT EDUCATION:  Education details: HEP update Person educated: Patient Education method: Explanation, Demonstration, and Handouts Education comprehension: verbalized understanding, returned demonstration, and needs further  education  HOME EXERCISE PROGRAM: Access Code: [email or web address] URL: [email or web address] Date: 11/07/2023 Prepared by: Braylin Clark  Exercises - Modified Thomas Stretch  - 1 x daily - 7 x weekly - 3 sets - 3 reps - 1 min  hold - Standing Lumbar Extension  - 1 x daily - 7 x weekly - 3 sets - 3 reps - Seated Hamstring Stretch with Strap  - 1 x daily - 7 x weekly - 2 sets - 2 reps - 30 sec hold - Heel Raises with Counter Support  - 1 x daily - 7 x weekly - 2 sets - 10 reps - Hooklying Isometric Clamshell  - 1 x daily - 7 x weekly - 2 sets -  10 reps - Gastroc Stretch on Wall  - 1 x daily - 7 x weekly - 2 sets - 30 sec hold - Standing Terminal Knee Extension with Resistance  - 1 x daily - 7 x weekly - 2 sets - 10 reps - 3 sec hold - Supine Knee Extension Stretch on Towel Roll  - 1 x daily - 7 x weekly - 2 sets - 30 sec hold - Long Sitting Quad Set with Towel Roll Under Heel  - 1 x daily - 7 x weekly - 2 sets - 10 reps - 3 sec hold - Prone Terminal Knee Extension  - 1 x daily - 7 x weekly - 2 sets - 10 reps - Prone Quadriceps Stretch with Strap  - 1 x daily - 7 x weekly - 2 sets - 2 reps - 30 sec hold - Prone Knee Extension Hang  - 1 x daily - 7 x weekly - 1 sets - 1 reps - 2 min hold  ASSESSMENT:  CLINICAL IMPRESSION: 14th PT visit, surgery for L TKA was nearly 7 weeks ago.  Persistent L knee flexion position with very tight capsule.  Added more variety of stretches to improve her L knee extension tightness. She is to see the surgeon in 2 weeks.  Still quite edematous L ant knee as well.  Is to continue for 2 more visits then will reassess. Focused on improving knee extension and terminal quad firing. Increased manual work and joint mobilizations From eval: Patient is a 48 y.o. F who was seen today for physical therapy evaluation and treatment for L TKR on 10/17/23. PMH significant for prior L knee surgery (did not have full range) as well as L side sciatica. Assessment significant for  decreased L knee ROM, poor quad activation, edema and L>R LE weakness leading to decreased balance, gait dysfunction, and transfers affecting home and community mobility. Pt will benefit from PT to address these deficits for return to her baseline function.   OBJECTIVE IMPAIRMENTS: Abnormal gait, decreased activity tolerance, decreased balance, decreased endurance, decreased mobility, difficulty walking, decreased ROM, decreased strength, hypomobility, increased edema, increased fascial restrictions, increased muscle spasms, impaired flexibility, impaired sensation, postural dysfunction, and pain.     GOALS: Goals reviewed with patient? Yes   SHORT TERM GOALS: Target date: 11/18/2023   Independent with initial HEP. Baseline: Newly issued on eval Goal status: MET- 10/28/23  2.  Pt will have improved L knee ROM from to 10-90 deg Baseline: 25-80 PROM, 40-75 AROM Goal status: MET- 11/07/23  3.  Pt will be able to wean to [facility] for home mobility utilizing reciprocal gait pattern Baseline: RW utilized with early step through pattern Goal status: MET - 11/18/23   LONG TERM GOALS: Target date: 12/16/2023  Independent with advanced/ongoing HEP to improve outcomes and carryover.  Baseline:  Goal status: INITIAL  2.  Gabriela Bean will demonstrate L knee flexion to 110 deg to ascend/descend stairs. Baseline: 80 deg AROM Goal status: INITIAL  3.  Gabriela Bean will demonstrate L knee extension = to R knee for safety with gait. Baseline: R knee = 5 deg from full ext, L knee = 25 deg from full ext Goal status: INITIAL  4.  Gabriela Bean will be able to ambulate 1000' ind and normal gait pattern to access community/work.  Baseline: currently using RW Goal status: INITIAL  5.  Gabriela Bean will be able to ascend/descend stairs with 1 HR and reciprocal step pattern safely to access home and  community.  Baseline: not performing stairs Goal status: INITIAL  6.  Gabriela Bean will demonstrate >  19/24 on DGI to demonstrate decreased risk of falls.   Baseline: 12/24 Goal status: INITIAL  7.  Gabriela Bean will demonstrate improved functional LE strength by completing 5x STS in <10 seconds.  (MCID 5 seconds) Baseline: 15.79 sec Goal status: INITIAL  8.  Gabriela Bean will have improved LEFS to >/=15/80 to demo MCID Baseline: 6/80 Goal status: INITIAL   PLAN:  PT FREQUENCY: 2x/week  PT DURATION: 8 weeks  PLANNED INTERVENTIONS: 97164- PT Re-evaluation, 97110-Therapeutic exercises, 97530- Therapeutic activity, 97112- Neuromuscular re-education, 97535- Self Care, 16109- Manual therapy, [phone]- Gait training, [phone]- Aquatic Therapy, [phone]- Electrical stimulation (unattended), 97035- Ultrasound, 29562- Ionotophoresis 4mg /ml Dexamethasone, Patient/Family education, Balance training, Stair training, Taping, Dry Needling, Joint mobilization, Spinal mobilization, Cryotherapy, and Moist heat  PLAN FOR NEXT SESSION: Knee ROM, L LE strengthening. Gait training   Renay Crammer L Doreen Garretson, PT,DPT, OCS 12/10/2023, 4:01 PM

## 2023-12-12 ENCOUNTER — Ambulatory Visit

## 2023-12-12 DIAGNOSIS — M6281 Muscle weakness (generalized): Secondary | ICD-10-CM

## 2023-12-12 DIAGNOSIS — G8929 Other chronic pain: Secondary | ICD-10-CM

## 2023-12-12 DIAGNOSIS — R262 Difficulty in walking, not elsewhere classified: Secondary | ICD-10-CM

## 2023-12-12 DIAGNOSIS — M25662 Stiffness of left knee, not elsewhere classified: Secondary | ICD-10-CM

## 2023-12-12 DIAGNOSIS — M25562 Pain in left knee: Secondary | ICD-10-CM | POA: Diagnosis not present

## 2023-12-12 NOTE — Therapy (Signed)
 OUTPATIENT PHYSICAL THERAPY LOWER EXTREMITY TREATMENT   Patient Name: Lucita Montoya MRN: [id number] DOB:06/20/1976, 48 y.o., female Today's Date: 12/12/2023  END OF SESSION:  PT End of Session - 12/12/23 1021     Visit Number 15    Number of Visits 16    Date for PT Re-Evaluation 12/16/23    Authorization Type [employer]    Progress Note Due on Visit 10    PT Start Time 1015    PT Stop Time 1100    PT Time Calculation (min) 45 min    Activity Tolerance Patient tolerated treatment well    Behavior During Therapy WFL for tasks assessed/performed                Past Medical History:  Diagnosis Date   ALLERGIC RHINITIS 01/06/2009   Depression    Family history of BRCA2 gene positive    Family history of breast cancer    Family history of ovarian cancer    Family history of prostate cancer    Infertility, female    VBAC, delivered, current hospitalization 09/01/2012   Past Surgical History:  Procedure Laterality Date   CESAREAN SECTION  03/25/10   KNEE SURGERY Left 2010   torn NCL   Patient Active Problem List   Diagnosis Date Noted   Prediabetes 10/09/2023   Anxiety disorder 04/11/2023   Primary osteoarthritis of both first carpometacarpal joints 08/05/2019   Genetic testing 01/27/2019   Family history of breast cancer    Family history of ovarian cancer    Family history of prostate cancer    Family history of BRCA2 gene positive    Pain in left knee 03/20/2018   ROM (rupture of membranes), premature 09/01/2012   Acute sinus infection 08/30/2012   Dyspnea and respiratory abnormality 08/30/2012   Allergic rhinitis 01/06/2009    PCP: Gavin Kast, FNP  REFERRING PROVIDER: Osa Blase, MD  REFERRING DIAG: [phone] (ICD-10-CM) - S/P total knee replacement  THERAPY DIAG:  Chronic pain of left knee  Stiffness of left knee, not elsewhere classified  Difficulty in walking, not elsewhere classified  Muscle weakness (generalized)  Rationale for Evaluation  and Treatment: Rehabilitation  ONSET DATE: 10/17/23 L TKA  SUBJECTIVE:   SUBJECTIVE STATEMENT:   Pt reports she is unsure if making any improvement   Eval: Pt reports a lot of swelling and she had a lot of bleeding. Has been icing and getting up every hour. Pt was told not to do all of the take home exercises. Will be seeing ortho this afternoon.   PERTINENT HISTORY: L sciatica PAIN:  Are you having pain? Yes: NPRS scale: 2/10 currently Pain location: anterior knee and back of thigh Pain description: dull aching in thigh, sharpness in knee Aggravating factors: Walking, transfers Relieving factors: ice, pain medicine  PRECAUTIONS: None  RED FLAGS: None   WEIGHT BEARING RESTRICTIONS: No  FALLS:  Has patient fallen in last 6 months? No  LIVING ENVIRONMENT: Lives with: lives with their family son and daughter Lives in: House/apartment Stairs: Yes: Internal: 13 steps; on left going up and External: 3 steps; on right going up (staying downstairs for now) Has following equipment at home: Single point cane and Walker - 2 wheeled  OCCUPATION: Therapist -- mostly desk work   PLOF: Independent  PATIENT GOALS: Return to work and normal activities  NEXT MD VISIT: 11/27/23  OBJECTIVE:  Note: Objective measures were completed at Evaluation unless otherwise noted.  DIAGNOSTIC FINDINGS: nothing new in Epic  PATIENT  SURVEYS:  LEFS 6/80 = 7.5%  SENSATION: A little numbness on the front  MUSCLE LENGTH: Hamstrings: Left limited due to edema Thomas test: Did not assess  POSTURE: weight shift right and L knee flexed  PALPATION: TTP ant and posterior L thigh, ant knee  LOWER EXTREMITY ROM:  Active ROM Right eval Left eval 10/23/23 11/07/23 11/18/23 11/25/23 12/05/23 12/12/23  Hip flexion          Hip extension          Hip abduction          Hip adduction          Hip internal rotation          Hip external rotation          Knee flexion 115 AROM 75 AROM 80 PROM 83 AROM  105  PROM 110 PROM 105 AROM    Knee extension -5 AROM -40 AROM -25 PROM  6  -6 PROM -10 AROM 8 AROM 8 - supported supine  Ankle dorsiflexion          Ankle plantarflexion          Ankle inversion          Ankle eversion           (Blank rows = not tested)  LOWER EXTREMITY MMT:  MMT Right eval Left eval  Hip flexion 5 4*  Hip extension    Hip abduction    Hip adduction    Hip internal rotation    Hip external rotation    Knee flexion 5 4  Knee extension 5 2+  Ankle dorsiflexion 5 5  Ankle plantarflexion    Ankle inversion    Ankle eversion     (Blank rows = not tested, * = pain)  LOWER EXTREMITY SPECIAL TESTS:  Did not assess  FUNCTIONAL TESTS:  5 times sit to stand: 15.79 sec DGI: 12/24  GAIT: Distance walked: Into clinic Assistive device utilized: Environmental consultant - 2 wheeled Level of assistance: Modified independence Comments: Antalgic, decreased L knee flexion during swing, decreased extension during stance, early step through pattern                                                                                                                                TREATMENT DATE:  12/12/23: Bike level 3 x 6 min Supine knee extension stretch heel propped up 3x15" Prone IASTM to R medial gastroc and hamstrings Prone quad set + therapist overpressure into knee extension Prone contract + relax 5x5" for knee ext Step downs 4' 2x10 Step ups 6' 2x10  Leg ext 15lb 2x10 BLE; 5lb x 20 LLE  12/10/23: Bike level 3 x 5 min Supine for initial measurement of L knee ext -20 AP glides L tibia med to lat direction, 4 sets 30 sec  Prone with ankles over foam roller for myofascial release L post knee capsule with blade Prone TKE's with  ankles over foam roller, 5 sec holds, 10 reps Supine for therapist providing manual overpressure for L hamstring stretch with overpressure AP for post capsular stretch, pt utilizing strap to provide stretch Instructed in prolonged passive post L capsule stretch  while in sitting with L hip above knee, knee above ankle , to utilized 15 to 30 min intervals Instructed in door frame hamstring stretch from supine position with pt able to provide overpressure L ant knee at intervals Post leans on wall for heel rocks B to engage terminal quads   Game ready at end of session 10 min 34degrees with L Le elevated  12/05/23 Bike L3x52min; x 2 min 3 notches back to extend her knee Prone knee hang + quad set and PT overpressure Knee extension mobilizations in sitting, supine, and prone tibia on femur to improve extension Thomas stretch x 1 min SAQ assisted with TKE x 10 Prone hang stretch w/ moist heat x 12/02/23 Prone knee hang x2 min Prone knee hang + quad set and PT overpressure Supine quad set with PT overpressure Grade III tibiofemoral mobs for ext Supine SLR with strap + quad set 3x10 Standing foot on aerobic step, into gastroc stretch on L 3x10 with MWM for ext Fwd step up on aerobic step on L focusing on terminal knee extension 3x10 Rockerboard side to side working on knee flexion/ext 30x3" Double leg leg press 55# 2x10, single leg 25# 2x10 Game Ready x 10 min L knee med compression 34 deg   11/28/23 Recumbent Bike L2x35min Seated tibiofemoral mobs PA for extension STM to L hamstrings in prone Supine knee extension stretch with OP Prone x 2 min 3lb weight  Double leg leg press 45# 2x10 Leg curls 20lb 2x10 BLE Leg ext 15lb 2x10 BLE Runner stretch with tibia mobilization to improve extension Game Ready x 10 min L knee med compression 34 deg   11/25/23 Supine feet on pball knee flexion 2x10, with towel for increased knee flexion 2x10 Supine bridge knees bent on pball 2x10 Supine bridge knees straight on pball 2x10 Prone knee hang with 2# weight x2 min Prone knee hang with quad set 10x3", with PT MWM for knee extension 2x10  Supine quad set with PT overpressure 2x10 Recumbent bike seat 8 x 3 min, seat 7 x 2 min, seat 6 x 1 min  Double leg  leg press 45# 2x10 Single leg leg press 20# x10 Single leg standing, cone tap fwd & to the side 2x10 Staggered stance, heel strike with quad set into stance phase tap fwd/bwd 2x10 Side step with red TB x5 laps by counter Monster walk fwd/bwd red TB x5 laps by counter Game Ready x 10 min L knee low compression 34 deg    11/18/23 Seated hamstring stretch 2x30" Prone knee hang 3x 1 min Prone knee hang with PNF contract/relax knee flex/ext 10x3" Prone hamstring curl 3# 2x10 Prone hip ext with knee flexed 2x10 Manual therapy: IASTM L hamstring, grade II to III tibiofemoral mobs for ext Standing hip abd 3# 2x10 Standing hip ext 3# 2x10 Standing gastroc stretch x30" Counter squat touch to chair 2x10 Seated knee flexion self stretch 3x30" Recumbent bike seat 8; 2 min forward (focus on keeping ankle DF), 2 min bwd Game Ready x 15 min L knee low compression 34 deg   11/11/23 Nustep L5x22min Prone knee hang x1 min Roller to L hamstring, STM & TPR L hamstring Grade II to III tibiofemoral mobs for ext Prone PNF flex/ext 10x5" Prone  quad set 2x10 Prone hip ext 2x10 Prone knee flex with strap 2x30" Prone hamstring curl at end range 10x3" Recumbent bike x 5 min partial revolutions seat 9 into full revolutions Wall squat 2x10 Terminal knee ext ball against wall 2x10 Game Ready x 10 min L knee low compression 34 deg   11/07/23 Nustep L5x70min  Bike x 4 min partial revolutions seat 7  Prone TKE 10x5" Prone quad stretch with strap 2x30" Prone knee extension hang x 2 min Seated gastroc + HS stretch with strap 2x30" Tibia on femur mobs for extension grade III-IV Game Ready x 10 min L knee low compression 34 deg  11/04/23 Supine feet on ball, knee flex/ext x20 Supine feet on ball, hamstring setting with knees flexed 2x10x3" 90/90 knees & hips x2 min for knee flexion Grade II to III tibiofemoral knee mob for flexion and then ext Patellar mobs all directions Walk feet back for knee flexion  3x10" (up to 95 deg) Supine feet on ball, knee ext glute set x10 Supine feet on ball, knee ext bridge x10 Supine hamstring stretch with strap x30" Knee ext stretch x2 min Supine hip flexor stretch off EOB 2x30" Hip/calf stretch 2x30" Terminal knee ext red TB 2x10 Sit<>stand 2x10  Amb with and without cane x 2 laps Game Ready x 10 min L knee low compression 34 deg    10/30/23 Therapeutic Exercise: Nustep L5 x 6 min Ues/LEs Seated hamstring stretch 2x30" Seated gastroc stretch with strap x30" Seated adductor stretch 2x30" Supine clamshell red TB 2x10 Supine knee flexion self stretch walking feet  Towel under foot, quad set x10 (15 deg from full ext)  Neuro Re-ed: Supine SAQ + ball squeeze 2x10 Supine SAQ + hip abd against red TB 2x10 Prolonged knee ext with foot on rolled towel 2x1 min SLR with strap assist 2x10 Standing L<>R weight shift with quad set   Manual Therapy: Grade II tibiofemoral joint mobs for knee flexion  Vaso:  Low compression, 10 min, 34 deg around R knee   10/25/23 Therapeutic Exercise: to improve strength, ROM, flexibility, and endurance  Seated heel slides AROM x 10; AAROM with strap x 10 Seated L LAQ x 10 Seated hamstring stretch with strap 2x30" Supine QS with towel under knee x 20 Supine L SLR x 10- decreased TKE but no quad lag Standing heel raises 2x10 Standing L hip extension 2x10  Rec bike x 3 min partial rev GAIT TRAINING: To normalize gait pattern and improve safety. SPC supervision- L hip circumduction on swing phase otherwise stable with cane 180 ft Game Ready x 10 min L knee low compression 34 deg   10/25/23 Therapeutic Exercise: to improve strength, ROM, flexibility, and endurance  Nustep L3x63min UE/LE Seated LAQ 2x5 Seated heel slide x 10 Quad sets 10x5" Thomas stretch 1 min 2x Standing lumbar extension w/ walker x 10  Gait Training: With walker supervision 360 ft- cues for heel strike, increased knee flexion, increased hip  extension, upright posture   Game Ready x 10 min L knee low compression 34 deg    PATIENT EDUCATION:  Education details: HEP update Person educated: Patient Education method: Explanation, Demonstration, and Handouts Education comprehension: verbalized understanding, returned demonstration, and needs further education  HOME EXERCISE PROGRAM: Access Code: [email or web address] URL: [email or web address] Date: 11/07/2023 Prepared by: Kathyjo Briere  Exercises - Modified Thomas Stretch  - 1 x daily - 7 x weekly - 3 sets - 3 reps - 1 min  hold - Standing Lumbar Extension  -  1 x daily - 7 x weekly - 3 sets - 3 reps - Seated Hamstring Stretch with Strap  - 1 x daily - 7 x weekly - 2 sets - 2 reps - 30 sec hold - Heel Raises with Counter Support  - 1 x daily - 7 x weekly - 2 sets - 10 reps - Hooklying Isometric Clamshell  - 1 x daily - 7 x weekly - 2 sets - 10 reps - Gastroc Stretch on Wall  - 1 x daily - 7 x weekly - 2 sets - 30 sec hold - Standing Terminal Knee Extension with Resistance  - 1 x daily - 7 x weekly - 2 sets - 10 reps - 3 sec hold - Supine Knee Extension Stretch on Towel Roll  - 1 x daily - 7 x weekly - 2 sets - 30 sec hold - Long Sitting Quad Set with Towel Roll Under Heel  - 1 x daily - 7 x weekly - 2 sets - 10 reps - 3 sec hold - Prone Terminal Knee Extension  - 1 x daily - 7 x weekly - 2 sets - 10 reps - Prone Quadriceps Stretch with Strap  - 1 x daily - 7 x weekly - 2 sets - 2 reps - 30 sec hold - Prone Knee Extension Hang  - 1 x daily - 7 x weekly - 1 sets - 1 reps - 2 min hold  ASSESSMENT:  CLINICAL IMPRESSION: Pt continues to show knee extension lacking 8 degrees. DGI score has improved and met LTG #7. She showed some deficits with stair navigation so we worked on step ups/down. Cue required for slow/eccentrics with exercises. Will  anticipate patient continued skilled PT going forward. Focused on improving knee extension and terminal quad firing. Increased manual  work and joint mobilizations From eval: Patient is a 48 y.o. F who was seen today for physical therapy evaluation and treatment for L TKR on 10/17/23. PMH significant for prior L knee surgery (did not have full range) as well as L side sciatica. Assessment significant for decreased L knee ROM, poor quad activation, edema and L>R LE weakness leading to decreased balance, gait dysfunction, and transfers affecting home and community mobility. Pt will benefit from PT to address these deficits for return to her baseline function.   OBJECTIVE IMPAIRMENTS: Abnormal gait, decreased activity tolerance, decreased balance, decreased endurance, decreased mobility, difficulty walking, decreased ROM, decreased strength, hypomobility, increased edema, increased fascial restrictions, increased muscle spasms, impaired flexibility, impaired sensation, postural dysfunction, and pain.     GOALS: Goals reviewed with patient? Yes   SHORT TERM GOALS: Target date: 11/18/2023   Independent with initial HEP. Baseline: Newly issued on eval Goal status: MET- 10/28/23  2.  Pt will have improved L knee ROM from to 10-90 deg Baseline: 25-80 PROM, 40-75 AROM Goal status: MET- 11/07/23  3.  Pt will be able to wean to [facility] for home mobility utilizing reciprocal gait pattern Baseline: RW utilized with early step through pattern Goal status: MET - 11/18/23   LONG TERM GOALS: Target date: 12/16/2023  Independent with advanced/ongoing HEP to improve outcomes and carryover.  Baseline:  Goal status: INITIAL  2.  Dewayne Ford will demonstrate L knee flexion to 110 deg to ascend/descend stairs. Baseline: 80 deg AROM Goal status: INITIAL  3.  Dewayne Ford will demonstrate L knee extension = to R knee for safety with gait. Baseline: R knee = 5 deg from full ext, L knee = 25  deg from full ext Goal status: INITIAL  4.  Mykenzie Ebanks will be able to ambulate 1000' ind and normal gait pattern to access community/work.  Baseline:  currently using RW Goal status: INITIAL  5.  Nayely Dingus will be able to ascend/descend stairs with 1 HR and reciprocal step pattern safely to access home and community.  Baseline: not performing stairs Goal status: IN PROGRESS- 12/12/23 decreased L eccentric control, uses momentum to ascend with L knee  6.  Briasia Flinders will demonstrate > 19/24 on DGI to demonstrate decreased risk of falls.   Baseline: 12/24 Goal status: MET- 12/12/23  7.  Dewayne Ford will demonstrate improved functional LE strength by completing 5x STS in <10 seconds.  (MCID 5 seconds) Baseline: 15.79 sec Goal status: INITIAL  8.  Inola Lisle will have improved LEFS to >/=15/80 to demo MCID Baseline: 6/80 Goal status: INITIAL   PLAN:  PT FREQUENCY: 2x/week  PT DURATION: 8 weeks  PLANNED INTERVENTIONS: 97164- PT Re-evaluation, 97110-Therapeutic exercises, 97530- Therapeutic activity, 97112- Neuromuscular re-education, 97535- Self Care, 16109- Manual therapy, [phone]- Gait training, [phone]- Aquatic Therapy, [phone]- Electrical stimulation (unattended), 97035- Ultrasound, 29562- Ionotophoresis 4mg /ml Dexamethasone, Patient/Family education, Balance training, Stair training, Taping, Dry Needling, Joint mobilization, Spinal mobilization, Cryotherapy, and Moist heat  PLAN FOR NEXT SESSION: reaasess, plan for recert; Knee ROM, L LE strengthening. Gait training   Susen Haskew L Cinthya Bors, PTA 12/12/2023, 11:13 AM

## 2023-12-16 ENCOUNTER — Ambulatory Visit

## 2023-12-19 ENCOUNTER — Ambulatory Visit: Attending: Orthopedic Surgery

## 2023-12-19 DIAGNOSIS — G8929 Other chronic pain: Secondary | ICD-10-CM | POA: Diagnosis present

## 2023-12-19 DIAGNOSIS — R262 Difficulty in walking, not elsewhere classified: Secondary | ICD-10-CM | POA: Insufficient documentation

## 2023-12-19 DIAGNOSIS — M6281 Muscle weakness (generalized): Secondary | ICD-10-CM | POA: Diagnosis present

## 2023-12-19 DIAGNOSIS — M25662 Stiffness of left knee, not elsewhere classified: Secondary | ICD-10-CM | POA: Insufficient documentation

## 2023-12-19 DIAGNOSIS — M25562 Pain in left knee: Secondary | ICD-10-CM | POA: Diagnosis present

## 2023-12-19 NOTE — Therapy (Signed)
 OUTPATIENT PHYSICAL THERAPY LOWER EXTREMITY TREATMENT Progress Note Reporting Period 10/21/23 to 12/19/23  See note below for Objective Data and Assessment of Progress/Goals.       Patient Name: Dorrine Montone MRN: [id number] DOB:[birth year], 48 y.o., female Today's Date: 12/19/2023  END OF SESSION:  PT End of Session - 12/19/23 1402     Visit Number 16    Date for PT Re-Evaluation 02/13/24    Authorization Type [employer]    Progress Note Due on Visit 10    PT Start Time 1316    PT Stop Time 1400    PT Time Calculation (min) 44 min    Activity Tolerance Patient tolerated treatment well    Behavior During Therapy WFL for tasks assessed/performed                 Past Medical History:  Diagnosis Date   ALLERGIC RHINITIS 01/06/2009   Depression    Family history of BRCA2 gene positive    Family history of breast cancer    Family history of ovarian cancer    Family history of prostate cancer    Infertility, female    VBAC, delivered, current hospitalization 09/01/2012   Past Surgical History:  Procedure Laterality Date   CESAREAN SECTION  03/25/10   KNEE SURGERY Left 2010   torn NCL   Patient Active Problem List   Diagnosis Date Noted   Prediabetes 10/09/2023   Anxiety disorder 04/11/2023   Primary osteoarthritis of both first carpometacarpal joints 08/05/2019   Genetic testing 01/27/2019   Family history of breast cancer    Family history of ovarian cancer    Family history of prostate cancer    Family history of BRCA2 gene positive    Pain in left knee 03/20/2018   ROM (rupture of membranes), premature 09/01/2012   Acute sinus infection 08/30/2012   Dyspnea and respiratory abnormality 08/30/2012   Allergic rhinitis 01/06/2009    PCP: Gavin Kast, FNP  REFERRING PROVIDER: Osa Blase, MD  REFERRING DIAG: [phone] (ICD-10-CM) - S/P total knee replacement  THERAPY DIAG:  Chronic pain of left knee  Stiffness of left knee, not elsewhere  classified  Difficulty in walking, not elsewhere classified  Muscle weakness (generalized)  Rationale for Evaluation and Treatment: Rehabilitation  ONSET DATE: 10/17/23 L TKA  SUBJECTIVE:   SUBJECTIVE STATEMENT:   Pt reports she went to a concert over the weekend, missed a step and fall onto her knee. She shows a scratch right below the patella across the knee and reports increased swelling.   Eval: Pt reports a lot of swelling and she had a lot of bleeding. Has been icing and getting up every hour. Pt was told not to do all of the take home exercises. Will be seeing ortho this afternoon.   PERTINENT HISTORY: L sciatica PAIN:  Are you having pain? Yes: NPRS scale: 4/10 currently Pain location: anterior knee  Pain description: dull aching in thigh, sharpness in knee Aggravating factors: Walking, transfers Relieving factors: ice, pain medicine  PRECAUTIONS: None  RED FLAGS: None   WEIGHT BEARING RESTRICTIONS: No  FALLS:  Has patient fallen in last 6 months? No  LIVING ENVIRONMENT: Lives with: lives with their family son and daughter Lives in: House/apartment Stairs: Yes: Internal: 13 steps; on left going up and External: 3 steps; on right going up (staying downstairs for now) Has following equipment at home: Single point cane and Walker - 2 wheeled  OCCUPATION: Therapist -- mostly desk work   PLOF:  Independent  PATIENT GOALS: Return to work and normal activities  NEXT MD VISIT: 11/27/23  OBJECTIVE:  Note: Objective measures were completed at Evaluation unless otherwise noted.  DIAGNOSTIC FINDINGS: nothing new in Epic  PATIENT SURVEYS:  LEFS 6/80 = 7.5%  SENSATION: A little numbness on the front  MUSCLE LENGTH: Hamstrings: Left limited due to edema Thomas test: Did not assess  POSTURE: weight shift right and L knee flexed  PALPATION: TTP ant and posterior L thigh, ant knee  LOWER EXTREMITY ROM:  Active ROM Right eval Left eval 10/23/23 11/07/23 11/18/23  11/25/23 12/05/23 12/12/23  Hip flexion          Hip extension          Hip abduction          Hip adduction          Hip internal rotation          Hip external rotation          Knee flexion 115 AROM 75 AROM 80 PROM 83 AROM  105 PROM 110 PROM 105 AROM    Knee extension -5 AROM -40 AROM -25 PROM  6  -6 PROM -10 AROM 8 AROM 8 - supported supine  Ankle dorsiflexion          Ankle plantarflexion          Ankle inversion          Ankle eversion           (Blank rows = not tested)  LOWER EXTREMITY MMT:  MMT Right eval Left eval  Hip flexion 5 4*  Hip extension    Hip abduction    Hip adduction    Hip internal rotation    Hip external rotation    Knee flexion 5 4  Knee extension 5 2+  Ankle dorsiflexion 5 5  Ankle plantarflexion    Ankle inversion    Ankle eversion     (Blank rows = not tested, * = pain)  LOWER EXTREMITY SPECIAL TESTS:  Did not assess  FUNCTIONAL TESTS:  5 times sit to stand: 15.79 sec DGI: 12/24  GAIT: Distance walked: Into clinic Assistive device utilized: Environmental consultant - 2 wheeled Level of assistance: Modified independence Comments: Antalgic, decreased L knee flexion during swing, decreased extension during stance, early step through pattern                                                                                                                                TREATMENT DATE:  12/19/23: Bike level 3 x 6 min Gait: 1000' no AD, decreased L TKE, decreased L knee flexion during swing, stable  5xSTS- 8 seconds Lower Extremity Functional Score: 44 / 80 = 55.0 % Leg extension 15lb 2x10 BLE Leg curls 25lb 2x10 BLE Standing TKE black TB 2x10 5 sec Prone TKE foam roll under feet Prone hip extension 2x10  8-105 ROM for L knee 12/12/23:  Bike level 3 x 6 min Supine knee extension stretch heel propped up 3x15" Prone IASTM to R medial gastroc and hamstrings Prone quad set + therapist overpressure into knee extension Prone contract + relax 5x5" for knee  ext Step downs 4' 2x10 Step ups 6' 2x10  Leg ext 15lb 2x10 BLE; 5lb x 20 LLE  12/10/23: Bike level 3 x 5 min Supine for initial measurement of L knee ext -20 AP glides L tibia med to lat direction, 4 sets 30 sec  Prone with ankles over foam roller for myofascial release L post knee capsule with blade Prone TKE's with ankles over foam roller, 5 sec holds, 10 reps Supine for therapist providing manual overpressure for L hamstring stretch with overpressure AP for post capsular stretch, pt utilizing strap to provide stretch Instructed in prolonged passive post L capsule stretch while in sitting with L hip above knee, knee above ankle , to utilized 15 to 30 min intervals Instructed in door frame hamstring stretch from supine position with pt able to provide overpressure L ant knee at intervals Post leans on wall for heel rocks B to engage terminal quads   Game ready at end of session 10 min 34degrees with L Le elevated  12/05/23 Bike L3x65min; x 2 min 3 notches back to extend her knee Prone knee hang + quad set and PT overpressure Knee extension mobilizations in sitting, supine, and prone tibia on femur to improve extension Thomas stretch x 1 min SAQ assisted with TKE x 10 Prone hang stretch w/ moist heat x 12/02/23 Prone knee hang x2 min Prone knee hang + quad set and PT overpressure Supine quad set with PT overpressure Grade III tibiofemoral mobs for ext Supine SLR with strap + quad set 3x10 Standing foot on aerobic step, into gastroc stretch on L 3x10 with MWM for ext Fwd step up on aerobic step on L focusing on terminal knee extension 3x10 Rockerboard side to side working on knee flexion/ext 30x3" Double leg leg press 55# 2x10, single leg 25# 2x10 Game Ready x 10 min L knee med compression 34 deg   11/28/23 Recumbent Bike L2x63min Seated tibiofemoral mobs PA for extension STM to L hamstrings in prone Supine knee extension stretch with OP Prone x 2 min 3lb weight  Double leg  leg press 45# 2x10 Leg curls 20lb 2x10 BLE Leg ext 15lb 2x10 BLE Runner stretch with tibia mobilization to improve extension Game Ready x 10 min L knee med compression 34 deg   11/25/23 Supine feet on pball knee flexion 2x10, with towel for increased knee flexion 2x10 Supine bridge knees bent on pball 2x10 Supine bridge knees straight on pball 2x10 Prone knee hang with 2# weight x2 min Prone knee hang with quad set 10x3", with PT MWM for knee extension 2x10  Supine quad set with PT overpressure 2x10 Recumbent bike seat 8 x 3 min, seat 7 x 2 min, seat 6 x 1 min  Double leg leg press 45# 2x10 Single leg leg press 20# x10 Single leg standing, cone tap fwd & to the side 2x10 Staggered stance, heel strike with quad set into stance phase tap fwd/bwd 2x10 Side step with red TB x5 laps by counter Monster walk fwd/bwd red TB x5 laps by counter Game Ready x 10 min L knee low compression 34 deg    11/18/23 Seated hamstring stretch 2x30" Prone knee hang 3x 1 min Prone knee hang with PNF contract/relax knee flex/ext 10x3" Prone hamstring curl 3#  2x10 Prone hip ext with knee flexed 2x10 Manual therapy: IASTM L hamstring, grade II to III tibiofemoral mobs for ext Standing hip abd 3# 2x10 Standing hip ext 3# 2x10 Standing gastroc stretch x30" Counter squat touch to chair 2x10 Seated knee flexion self stretch 3x30" Recumbent bike seat 8; 2 min forward (focus on keeping ankle DF), 2 min bwd Game Ready x 15 min L knee low compression 34 deg   11/11/23 Nustep L5x57min Prone knee hang x1 min Roller to L hamstring, STM & TPR L hamstring Grade II to III tibiofemoral mobs for ext Prone PNF flex/ext 10x5" Prone quad set 2x10 Prone hip ext 2x10 Prone knee flex with strap 2x30" Prone hamstring curl at end range 10x3" Recumbent bike x 5 min partial revolutions seat 9 into full revolutions Wall squat 2x10 Terminal knee ext ball against wall 2x10 Game Ready x 10 min L knee low compression 34 deg    11/07/23 Nustep L5x53min  Bike x 4 min partial revolutions seat 7  Prone TKE 10x5" Prone quad stretch with strap 2x30" Prone knee extension hang x 2 min Seated gastroc + HS stretch with strap 2x30" Tibia on femur mobs for extension grade III-IV Game Ready x 10 min L knee low compression 34 deg  11/04/23 Supine feet on ball, knee flex/ext x20 Supine feet on ball, hamstring setting with knees flexed 2x10x3" 90/90 knees & hips x2 min for knee flexion Grade II to III tibiofemoral knee mob for flexion and then ext Patellar mobs all directions Walk feet back for knee flexion 3x10" (up to 95 deg) Supine feet on ball, knee ext glute set x10 Supine feet on ball, knee ext bridge x10 Supine hamstring stretch with strap x30" Knee ext stretch x2 min Supine hip flexor stretch off EOB 2x30" Hip/calf stretch 2x30" Terminal knee ext red TB 2x10 Sit<>stand 2x10  Amb with and without cane x 2 laps Game Ready x 10 min L knee low compression 34 deg    10/30/23 Therapeutic Exercise: Nustep L5 x 6 min Ues/LEs Seated hamstring stretch 2x30" Seated gastroc stretch with strap x30" Seated adductor stretch 2x30" Supine clamshell red TB 2x10 Supine knee flexion self stretch walking feet  Towel under foot, quad set x10 (15 deg from full ext)  Neuro Re-ed: Supine SAQ + ball squeeze 2x10 Supine SAQ + hip abd against red TB 2x10 Prolonged knee ext with foot on rolled towel 2x1 min SLR with strap assist 2x10 Standing L<>R weight shift with quad set   Manual Therapy: Grade II tibiofemoral joint mobs for knee flexion  Vaso:  Low compression, 10 min, 34 deg around R knee   10/25/23 Therapeutic Exercise: to improve strength, ROM, flexibility, and endurance  Seated heel slides AROM x 10; AAROM with strap x 10 Seated L LAQ x 10 Seated hamstring stretch with strap 2x30" Supine QS with towel under knee x 20 Supine L SLR x 10- decreased TKE but no quad lag Standing heel raises 2x10 Standing L hip  extension 2x10  Rec bike x 3 min partial rev GAIT TRAINING: To normalize gait pattern and improve safety. SPC supervision- L hip circumduction on swing phase otherwise stable with cane 180 ft Game Ready x 10 min L knee low compression 34 deg   10/25/23 Therapeutic Exercise: to improve strength, ROM, flexibility, and endurance  Nustep L3x16min UE/LE Seated LAQ 2x5 Seated heel slide x 10 Quad sets 10x5" Thomas stretch 1 min 2x Standing lumbar extension w/ walker x 10  Gait Training:  With walker supervision 360 ft- cues for heel strike, increased knee flexion, increased hip extension, upright posture   Game Ready x 10 min L knee low compression 34 deg    PATIENT EDUCATION:  Education details: HEP update Person educated: Patient Education method: Explanation, Demonstration, and Handouts Education comprehension: verbalized understanding, returned demonstration, and needs further education  HOME EXERCISE PROGRAM: Access Code: [email or web address] URL: [email or web address] Date: 11/07/2023 Prepared by: Indya Oliveria  Exercises - Modified Thomas Stretch  - 1 x daily - 7 x weekly - 3 sets - 3 reps - 1 min  hold - Standing Lumbar Extension  - 1 x daily - 7 x weekly - 3 sets - 3 reps - Seated Hamstring Stretch with Strap  - 1 x daily - 7 x weekly - 2 sets - 2 reps - 30 sec hold - Heel Raises with Counter Support  - 1 x daily - 7 x weekly - 2 sets - 10 reps - Hooklying Isometric Clamshell  - 1 x daily - 7 x weekly - 2 sets - 10 reps - Gastroc Stretch on Wall  - 1 x daily - 7 x weekly - 2 sets - 30 sec hold - Standing Terminal Knee Extension with Resistance  - 1 x daily - 7 x weekly - 2 sets - 10 reps - 3 sec hold - Supine Knee Extension Stretch on Towel Roll  - 1 x daily - 7 x weekly - 2 sets - 30 sec hold - Long Sitting Quad Set with Towel Roll Under Heel  - 1 x daily - 7 x weekly - 2 sets - 10 reps - 3 sec hold - Prone Terminal Knee Extension  - 1 x daily - 7 x weekly - 2 sets - 10  reps - Prone Quadriceps Stretch with Strap  - 1 x daily - 7 x weekly - 2 sets - 2 reps - 30 sec hold - Prone Knee Extension Hang  - 1 x daily - 7 x weekly - 1 sets - 1 reps - 2 min hold  ASSESSMENT:  CLINICAL IMPRESSION: Goals were reassessed today, with patient showing limited ROM of her L knee, causing altered gait mechanics and increasing her fall risk d/t to decreased TKE. Pt has met goals for LEFS and 5xSTS. Biggest remaining deficits are decreased TKE and decreased strength with stair navigation. She will continue to benefit from skilled therapy to address these deficits Focused on improving knee extension and terminal quad firing. Increased manual work and joint mobilizations From eval: Patient is a 48 y.o. F who was seen today for physical therapy evaluation and treatment for L TKR on 10/17/23. PMH significant for prior L knee surgery (did not have full range) as well as L side sciatica. Assessment significant for decreased L knee ROM, poor quad activation, edema and L>R LE weakness leading to decreased balance, gait dysfunction, and transfers affecting home and community mobility. Pt will benefit from PT to address these deficits for return to her baseline function.   OBJECTIVE IMPAIRMENTS: Abnormal gait, decreased activity tolerance, decreased balance, decreased endurance, decreased mobility, difficulty walking, decreased ROM, decreased strength, hypomobility, increased edema, increased fascial restrictions, increased muscle spasms, impaired flexibility, impaired sensation, postural dysfunction, and pain.     GOALS: Goals reviewed with patient? Yes   SHORT TERM GOALS: Target date: 11/18/2023   Independent with initial HEP. Baseline: Newly issued on eval Goal status: MET- 10/28/23  2.  Pt will have improved L knee ROM from to 10-90  deg Baseline: 25-80 PROM, 40-75 AROM Goal status: MET- 11/07/23  3.  Pt will be able to wean to [facility] for home mobility utilizing reciprocal gait  pattern Baseline: RW utilized with early step through pattern Goal status: MET - 11/18/23   LONG TERM GOALS: Target date: extended as of 12/19/23 8 weeks to to 02/13/24  Independent with advanced/ongoing HEP to improve outcomes and carryover.  Baseline:  Goal status: INITIAL  2.  Dewayne Ford will demonstrate L knee flexion to 110 deg to ascend/descend stairs. Baseline: 80 deg AROM Goal status: INITIAL  3.  Dewayne Ford will demonstrate L knee extension = to R knee for safety with gait. Baseline: R knee = 5 deg from full ext, L knee = 25 deg from full ext Goal status: INITIAL  4.  Larisha Vencill will be able to ambulate 1000' ind and normal gait pattern to access community/work.  Baseline: currently using RW Goal status: IN PROGRESS- 12/19/23  5.  Ranelle Auker will be able to ascend/descend stairs with 1 HR and reciprocal step pattern safely to access home and community.  Baseline: not performing stairs Goal status: IN PROGRESS- 12/12/23 decreased L eccentric control, uses momentum to ascend with L knee  6.  Jamel Dunton will demonstrate > 19/24 on DGI to demonstrate decreased risk of falls.   Baseline: 12/24 Goal status: MET- 12/12/23  7.  Dewayne Ford will demonstrate improved functional LE strength by completing 5x STS in <10 seconds.  (MCID 5 seconds) Baseline: 15.79 sec Goal status: MET- 12/19/23 8 seconds  8.  Lidiya Reise will have improved LEFS to >/=15/80 to demo MCID Baseline: 6/80 Goal status: MET- 12/19/23   PLAN:  PT FREQUENCY: 2x/week  PT DURATION: 8 weeks  PLANNED INTERVENTIONS: 97164- PT Re-evaluation, 97110-Therapeutic exercises, 97530- Therapeutic activity, 97112- Neuromuscular re-education, 97535- Self Care, 16109- Manual therapy, [phone]- Gait training, [phone]- Aquatic Therapy, [phone]- Electrical stimulation (unattended), 97035- Ultrasound, 29562- Ionotophoresis 4mg /ml Dexamethasone, Patient/Family education, Balance training, Stair training, Taping, Dry  Needling, Joint mobilization, Spinal mobilization, Cryotherapy, and Moist heat  PLAN FOR NEXT SESSION: reaasess, plan for recert; Knee ROM, L LE strengthening. Gait training   Agree with extension of current POC Coston Mandato L Dorsie Burich, PTA  Amy L Speaks, PT, DPT, OCS 12/19/2023, 2:03 PM

## 2023-12-24 ENCOUNTER — Encounter

## 2023-12-25 ENCOUNTER — Ambulatory Visit

## 2023-12-25 DIAGNOSIS — G8929 Other chronic pain: Secondary | ICD-10-CM

## 2023-12-25 DIAGNOSIS — M25562 Pain in left knee: Secondary | ICD-10-CM | POA: Diagnosis not present

## 2023-12-25 DIAGNOSIS — M6281 Muscle weakness (generalized): Secondary | ICD-10-CM

## 2023-12-25 DIAGNOSIS — R262 Difficulty in walking, not elsewhere classified: Secondary | ICD-10-CM

## 2023-12-25 DIAGNOSIS — M25662 Stiffness of left knee, not elsewhere classified: Secondary | ICD-10-CM

## 2023-12-25 NOTE — Therapy (Signed)
 OUTPATIENT PHYSICAL THERAPY LOWER EXTREMITY TREATMENT       Patient Name: Gabriela Bean MRN: [id number] DOB:May 30, 1976, 48 y.o., female Today's Date: 12/25/2023  END OF SESSION:        Past Medical History:  Diagnosis Date   ALLERGIC RHINITIS 01/06/2009   Depression    Family history of BRCA2 gene positive    Family history of breast cancer    Family history of ovarian cancer    Family history of prostate cancer    Infertility, female    VBAC, delivered, current hospitalization 09/01/2012   Past Surgical History:  Procedure Laterality Date   CESAREAN SECTION  03/25/10   KNEE SURGERY Left 2010   torn NCL   Patient Active Problem List   Diagnosis Date Noted   Prediabetes 10/09/2023   Anxiety disorder 04/11/2023   Primary osteoarthritis of both first carpometacarpal joints 08/05/2019   Genetic testing 01/27/2019   Family history of breast cancer    Family history of ovarian cancer    Family history of prostate cancer    Family history of BRCA2 gene positive    Pain in left knee 03/20/2018   ROM (rupture of membranes), premature 09/01/2012   Acute sinus infection 08/30/2012   Dyspnea and respiratory abnormality 08/30/2012   Allergic rhinitis 01/06/2009    PCP: Gavin Kast, FNP  REFERRING PROVIDER: Osa Blase, MD  REFERRING DIAG: [id number] (ICD-10-CM) - S/P total knee replacement  THERAPY DIAG:  Chronic pain of left knee  Stiffness of left knee, not elsewhere classified  Difficulty in walking, not elsewhere classified  Muscle weakness (generalized)  Rationale for Evaluation and Treatment: Rehabilitation  ONSET DATE: 10/17/23 L TKA  SUBJECTIVE:   SUBJECTIVE STATEMENT:   No new complaints.  Eval: Pt reports a lot of swelling and she had a lot of bleeding. Has been icing and getting up every hour. Pt was told not to do all of the take home exercises. Will be seeing ortho this afternoon.   PERTINENT HISTORY: L sciatica PAIN:  Are you having  pain? Yes: NPRS scale: 4/10 currently Pain location: anterior knee  Pain description: dull aching in thigh, sharpness in knee Aggravating factors: Walking, transfers Relieving factors: ice, pain medicine  PRECAUTIONS: None  RED FLAGS: None   WEIGHT BEARING RESTRICTIONS: No  FALLS:  Has patient fallen in last 6 months? No  LIVING ENVIRONMENT: Lives with: lives with their family son and daughter Lives in: House/apartment Stairs: Yes: Internal: 13 steps; on left going up and External: 3 steps; on right going up (staying downstairs for now) Has following equipment at home: Single point cane and Walker - 2 wheeled  OCCUPATION: Therapist -- mostly desk work   PLOF: Independent  PATIENT GOALS: Return to work and normal activities  NEXT MD VISIT: 11/27/23  OBJECTIVE:  Note: Objective measures were completed at Evaluation unless otherwise noted.  DIAGNOSTIC FINDINGS: nothing new in Epic  PATIENT SURVEYS:  LEFS 6/80 = 7.5%  SENSATION: A little numbness on the front  MUSCLE LENGTH: Hamstrings: Left limited due to edema Thomas test: Did not assess  POSTURE: weight shift right and L knee flexed  PALPATION: TTP ant and posterior L thigh, ant knee  LOWER EXTREMITY ROM:  Active ROM Right eval Left eval 10/23/23 11/07/23 11/18/23 11/25/23 12/05/23 12/12/23  Hip flexion          Hip extension          Hip abduction          Hip adduction  Hip internal rotation          Hip external rotation          Knee flexion 115 AROM 75 AROM 80 PROM 83 AROM  105 PROM 110 PROM 105 AROM    Knee extension -5 AROM -40 AROM -25 PROM  6  -6 PROM -10 AROM 8 AROM 8 - supported supine  Ankle dorsiflexion          Ankle plantarflexion          Ankle inversion          Ankle eversion           (Blank rows = not tested)  LOWER EXTREMITY MMT:  MMT Right eval Left eval  Hip flexion 5 4*  Hip extension    Hip abduction    Hip adduction    Hip internal rotation    Hip external  rotation    Knee flexion 5 4  Knee extension 5 2+  Ankle dorsiflexion 5 5  Ankle plantarflexion    Ankle inversion    Ankle eversion     (Blank rows = not tested, * = pain)  LOWER EXTREMITY SPECIAL TESTS:  Did not assess  FUNCTIONAL TESTS:  5 times sit to stand: 15.79 sec DGI: 12/24  GAIT: Distance walked: Into clinic Assistive device utilized: Walker - 2 wheeled Level of assistance: Modified independence Comments: Antalgic, decreased L knee flexion during swing, decreased extension during stance, early step through pattern                                                                                                                                TREATMENT DATE:  12/25/23: Bike level 3 x 6 min NMR:  Step downs from 4' step x 10, 2' book 2 x 10 Step ups 6' step 2x10 Standing 4 way hip with RTB anchored to furniture  PNF contract + relax 5x5; additional 3x5 for knee extension MT: Manual edema maasage to R knee PROM to L knee  12/19/23: Bike level 3 x 6 min Gait: 1000' no AD, decreased L TKE, decreased L knee flexion during swing, stable  5xSTS- 8 seconds Lower Extremity Functional Score: 44 / 80 = 55.0 % Leg extension 15lb 2x10 BLE Leg curls 25lb 2x10 BLE Standing TKE black TB 2x10 5 sec Prone TKE foam roll under feet Prone hip extension 2x10  8-105 ROM for L knee 12/12/23: Bike level 3 x 6 min Supine knee extension stretch heel propped up 3x15 Prone IASTM to R medial gastroc and hamstrings Prone quad set + therapist overpressure into knee extension Prone contract + relax 5x5 for knee ext Step downs 4' 2x10 Step ups 6' 2x10  Leg ext 15lb 2x10 BLE; 5lb x 20 LLE  12/10/23: Bike level 3 x 5 min Supine for initial measurement of L knee ext -20 AP glides L tibia med to lat direction, 4 sets 30 sec  Prone  with ankles over foam roller for myofascial release L post knee capsule with blade Prone TKE's with ankles over foam roller, 5 sec holds, 10 reps Supine for  therapist providing manual overpressure for L hamstring stretch with overpressure AP for post capsular stretch, pt utilizing strap to provide stretch Instructed in prolonged passive post L capsule stretch while in sitting with L hip above knee, knee above ankle , to utilized 15 to 30 min intervals Instructed in door frame hamstring stretch from supine position with pt able to provide overpressure L ant knee at intervals Post leans on wall for heel rocks B to engage terminal quads   Game ready at end of session 10 min 34degrees with L Le elevated    PATIENT EDUCATION:  Education details: HEP update Person educated: Patient Education method: Programmer, multimedia, Demonstration, and Handouts Education comprehension: verbalized understanding, returned demonstration, and needs further education  HOME EXERCISE PROGRAM: Access Code: [email or web address] URL: [email or web address] Date: 11/07/2023 Prepared by: Lelania Bia  Exercises - Modified Thomas Stretch  - 1 x daily - 7 x weekly - 3 sets - 3 reps - 1 min  hold - Standing Lumbar Extension  - 1 x daily - 7 x weekly - 3 sets - 3 reps - Seated Hamstring Stretch with Strap  - 1 x daily - 7 x weekly - 2 sets - 2 reps - 30 sec hold - Heel Raises with Counter Support  - 1 x daily - 7 x weekly - 2 sets - 10 reps - Hooklying Isometric Clamshell  - 1 x daily - 7 x weekly - 2 sets - 10 reps - Gastroc Stretch on Wall  - 1 x daily - 7 x weekly - 2 sets - 30 sec hold - Standing Terminal Knee Extension with Resistance  - 1 x daily - 7 x weekly - 2 sets - 10 reps - 3 sec hold - Supine Knee Extension Stretch on Towel Roll  - 1 x daily - 7 x weekly - 2 sets - 30 sec hold - Long Sitting Quad Set with Towel Roll Under Heel  - 1 x daily - 7 x weekly - 2 sets - 10 reps - 3 sec hold - Prone Terminal Knee Extension  - 1 x daily - 7 x weekly - 2 sets - 10 reps - Prone Quadriceps Stretch with Strap  - 1 x daily - 7 x weekly - 2 sets - 2 reps - 30 sec hold - Prone Knee  Extension Hang  - 1 x daily - 7 x weekly - 1 sets - 1 reps - 2 min hold  ASSESSMENT:  CLINICAL IMPRESSION: Efforts continued to focus on improving knee extension ROM through exercise and manual stretching. Knee extension remains at 8 degrees, she will see surgeon today for further evaluation. Worked on functional motor patterns with stair navigation.She had increased pain with 4' step downs today so we worked with a 2' book.    Focused on improving knee extension and terminal quad firing. Increased manual work and joint mobilizations From eval: Patient is a 48 y.o. F who was seen today for physical therapy evaluation and treatment for L TKR on 10/17/23. PMH significant for prior L knee surgery (did not have full range) as well as L side sciatica. Assessment significant for decreased L knee ROM, poor quad activation, edema and L>R LE weakness leading to decreased balance, gait dysfunction, and transfers affecting home and community mobility. Pt will benefit from PT to address these deficits  for return to her baseline function.   OBJECTIVE IMPAIRMENTS: Abnormal gait, decreased activity tolerance, decreased balance, decreased endurance, decreased mobility, difficulty walking, decreased ROM, decreased strength, hypomobility, increased edema, increased fascial restrictions, increased muscle spasms, impaired flexibility, impaired sensation, postural dysfunction, and pain.     GOALS: Goals reviewed with patient? Yes   SHORT TERM GOALS: Target date: 11/18/2023   Independent with initial HEP. Baseline: Newly issued on eval Goal status: MET- 10/28/23  2.  Pt will have improved L knee ROM from to 10-90 deg Baseline: 25-80 PROM, 40-75 AROM Goal status: MET- 11/07/23  3.  Pt will be able to wean to [facility] for home mobility utilizing reciprocal gait pattern Baseline: RW utilized with early step through pattern Goal status: MET - 11/18/23   LONG TERM GOALS: Target date: extended as of 12/19/23 8 weeks to to  02/13/24  Independent with advanced/ongoing HEP to improve outcomes and carryover.  Baseline:  Goal status: INITIAL  2.  Dewayne Ford will demonstrate L knee flexion to 110 deg to ascend/descend stairs. Baseline: 80 deg AROM Goal status: INITIAL  3.  Dewayne Ford will demonstrate L knee extension = to R knee for safety with gait. Baseline: R knee = 5 deg from full ext, L knee = 25 deg from full ext Goal status: INITIAL  4.  Lauralye Kinn will be able to ambulate 1000' ind and normal gait pattern to access community/work.  Baseline: currently using RW Goal status: IN PROGRESS- 12/19/23  5.  Simmone Cape will be able to ascend/descend stairs with 1 HR and reciprocal step pattern safely to access home and community.  Baseline: not performing stairs Goal status: IN PROGRESS- 12/12/23 decreased L eccentric control, uses momentum to ascend with L knee  6.  Arnold Depinto will demonstrate > 19/24 on DGI to demonstrate decreased risk of falls.   Baseline: 12/24 Goal status: MET- 12/12/23  7.  Dewayne Ford will demonstrate improved functional LE strength by completing 5x STS in <10 seconds.  (MCID 5 seconds) Baseline: 15.79 sec Goal status: MET- 12/19/23 8 seconds  8.  Kathrene Sinopoli will have improved LEFS to >/=15/80 to demo MCID Baseline: 6/80 Goal status: MET- 12/19/23   PLAN:  PT FREQUENCY: 2x/week  PT DURATION: 8 weeks  PLANNED INTERVENTIONS: 97164- PT Re-evaluation, 97110-Therapeutic exercises, 97530- Therapeutic activity, 97112- Neuromuscular re-education, 97535- Self Care, [id number]- Manual therapy, [phone]- Gait training, [phone]- Aquatic Therapy, [phone]- Electrical stimulation (unattended), 97035- Ultrasound, 02725- Ionotophoresis 4mg /ml Dexamethasone, Patient/Family education, Balance training, Stair training, Taping, Dry Needling, Joint mobilization, Spinal mobilization, Cryotherapy, and Moist heat  PLAN FOR NEXT SESSION: Knee ROM, L LE strengthening. Gait training     Emrey Thornley L  Aniqua Briere, PTA 12/25/2023, 10:22 AM

## 2024-01-01 ENCOUNTER — Ambulatory Visit: Admitting: Physical Therapy

## 2024-01-01 DIAGNOSIS — M25562 Pain in left knee: Secondary | ICD-10-CM | POA: Diagnosis not present

## 2024-01-01 DIAGNOSIS — R262 Difficulty in walking, not elsewhere classified: Secondary | ICD-10-CM

## 2024-01-01 DIAGNOSIS — M6281 Muscle weakness (generalized): Secondary | ICD-10-CM

## 2024-01-01 DIAGNOSIS — G8929 Other chronic pain: Secondary | ICD-10-CM

## 2024-01-01 DIAGNOSIS — M25662 Stiffness of left knee, not elsewhere classified: Secondary | ICD-10-CM

## 2024-01-01 NOTE — Therapy (Signed)
 OUTPATIENT PHYSICAL THERAPY LOWER EXTREMITY TREATMENT AND DISCHARGE  PHYSICAL THERAPY DISCHARGE SUMMARY  Visits from Start of Care: 18  Current functional level related to goals / functional outcomes: See below   Remaining deficits: See below   Education / Equipment: Finalized HEP   Patient agrees to discharge. Patient goals were partially met. Patient is being discharged due to being pleased with the current functional level.        Patient Name: Najwa Spillane MRN: [id number] DOB:Dec 24, 1975, 48 y.o., female Today's Date: 01/01/2024  END OF SESSION:  PT End of Session - 01/01/24 1448     Visit Number 18    Number of Visits 16    Date for PT Re-Evaluation 12/16/23    Authorization Type [employer]    Progress Note Due on Visit 10    PT Start Time 1449    PT Stop Time 1530    PT Time Calculation (min) 41 min    Activity Tolerance Patient tolerated treatment well    Behavior During Therapy WFL for tasks assessed/performed               Past Medical History:  Diagnosis Date   ALLERGIC RHINITIS 01/06/2009   Depression    Family history of BRCA2 gene positive    Family history of breast cancer    Family history of ovarian cancer    Family history of prostate cancer    Infertility, female    VBAC, delivered, current hospitalization 09/01/2012   Past Surgical History:  Procedure Laterality Date   CESAREAN SECTION  03/25/10   KNEE SURGERY Left 2010   torn NCL   Patient Active Problem List   Diagnosis Date Noted   Prediabetes 10/09/2023   Anxiety disorder 04/11/2023   Primary osteoarthritis of both first carpometacarpal joints 08/05/2019   Genetic testing 01/27/2019   Family history of breast cancer    Family history of ovarian cancer    Family history of prostate cancer    Family history of BRCA2 gene positive    Pain in left knee 03/20/2018   ROM (rupture of membranes), premature 09/01/2012   Acute sinus infection 08/30/2012   Dyspnea and respiratory  abnormality 08/30/2012   Allergic rhinitis 01/06/2009    PCP: Gavin Kast, FNP  REFERRING PROVIDER: Osa Blase, MD  REFERRING DIAG: [phone] (ICD-10-CM) - S/P total knee replacement  THERAPY DIAG:  Chronic pain of left knee  Stiffness of left knee, not elsewhere classified  Difficulty in walking, not elsewhere classified  Muscle weakness (generalized)  Rationale for Evaluation and Treatment: Rehabilitation  ONSET DATE: 10/17/23 L TKA  SUBJECTIVE:   SUBJECTIVE STATEMENT:   Pt states pain has been okay. Talked to ortho who states pt can be d/c-ed from PT.   Eval: Pt reports a lot of swelling and she had a lot of bleeding. Has been icing and getting up every hour. Pt was told not to do all of the take home exercises. Will be seeing ortho this afternoon.   PERTINENT HISTORY: L sciatica PAIN:  Are you having pain? Yes: NPRS scale: 4/10 currently Pain location: anterior knee  Pain description: dull aching in thigh, sharpness in knee Aggravating factors: Walking, transfers Relieving factors: ice, pain medicine  PRECAUTIONS: None  RED FLAGS: None   WEIGHT BEARING RESTRICTIONS: No  FALLS:  Has patient fallen in last 6 months? No  LIVING ENVIRONMENT: Lives with: lives with their family son and daughter Lives in: House/apartment Stairs: Yes: Internal: 13 steps; on left going up  and External: 3 steps; on right going up (staying downstairs for now) Has following equipment at home: Single point cane and Walker - 2 wheeled  OCCUPATION: Therapist -- mostly desk work   PLOF: Independent  PATIENT GOALS: Return to work and normal activities  NEXT MD VISIT: 11/27/23  OBJECTIVE:  Note: Objective measures were completed at Evaluation unless otherwise noted.  DIAGNOSTIC FINDINGS: nothing new in Epic  PATIENT SURVEYS:  LEFS 6/80 = 7.5%  SENSATION: A little numbness on the front  MUSCLE LENGTH: Hamstrings: Left limited due to edema Thomas test: Did not  assess  POSTURE: weight shift right and L knee flexed  PALPATION: TTP ant and posterior L thigh, ant knee  LOWER EXTREMITY ROM:  Active ROM Right eval Left eval 11/07/23 11/18/23 11/25/23 12/05/23 12/12/23 01/01/24  Hip flexion          Hip extension          Hip abduction          Hip adduction          Hip internal rotation          Hip external rotation          Knee flexion 115 AROM 75 AROM 80 PROM  105 PROM 110 PROM 105 AROM   115 PROM 105 AROM  Knee extension -5 AROM -40 AROM -25 PROM 6  -6 PROM -10 AROM 8 AROM 8 - supported supine -8 PROM -8 AROM LAQ  Ankle dorsiflexion          Ankle plantarflexion          Ankle inversion          Ankle eversion           (Blank rows = not tested)  LOWER EXTREMITY MMT:  MMT Right eval Left eval  Hip flexion 5 4*  Hip extension    Hip abduction    Hip adduction    Hip internal rotation    Hip external rotation    Knee flexion 5 4  Knee extension 5 2+  Ankle dorsiflexion 5 5  Ankle plantarflexion    Ankle inversion    Ankle eversion     (Blank rows = not tested, * = pain)  LOWER EXTREMITY SPECIAL TESTS:  Did not assess  FUNCTIONAL TESTS:  5 times sit to stand: 15.79 sec DGI: 12/24  GAIT: Distance walked: Into clinic Assistive device utilized: Environmental consultant - 2 wheeled Level of assistance: Modified independence Comments: Antalgic, decreased L knee flexion during swing, decreased extension during stance, early step through pattern                                                                                                                                TREATMENT DATE:  01/01/24: Bike L2 x 5 min Quadruped rock fwd/bwd for knee flexion 10x5 Kneel to tall kneel x20 Standing lunge on 4 step x20 Wall squat 2x10 Supine  knee ext slow stretch with 7# feet propped x2 min Supine quad set with feet propped 2x10 Knee ext with overpressure + quad set 2x10 Single leg terminal knee ext 2x10 Side step down 4 step 2x10 Forward  step down 2 step 2x10  12/25/23: Bike level 3 x 6 min NMR:  Step downs from 4' step x 10, 2' book 2 x 10 Step ups 6' step 2x10 Standing 4 way hip with RTB anchored to furniture  PNF contract + relax 5x5; additional 3x5 for knee extension MT: Manual edema maasage to R knee PROM to L knee  12/19/23: Bike level 3 x 6 min Gait: 1000' no AD, decreased L TKE, decreased L knee flexion during swing, stable  5xSTS- 8 seconds Lower Extremity Functional Score: 44 / 80 = 55.0 % Leg extension 15lb 2x10 BLE Leg curls 25lb 2x10 BLE Standing TKE black TB 2x10 5 sec Prone TKE foam roll under feet Prone hip extension 2x10  8-105 ROM for L knee  12/12/23: Bike level 3 x 6 min Supine knee extension stretch heel propped up 3x15 Prone IASTM to R medial gastroc and hamstrings Prone quad set + therapist overpressure into knee extension Prone contract + relax 5x5 for knee ext Step downs 4' 2x10 Step ups 6' 2x10  Leg ext 15lb 2x10 BLE; 5lb x 20 LLE  12/10/23: Bike level 3 x 5 min Supine for initial measurement of L knee ext -20 AP glides L tibia med to lat direction, 4 sets 30 sec  Prone with ankles over foam roller for myofascial release L post knee capsule with blade Prone TKE's with ankles over foam roller, 5 sec holds, 10 reps Supine for therapist providing manual overpressure for L hamstring stretch with overpressure AP for post capsular stretch, pt utilizing strap to provide stretch Instructed in prolonged passive post L capsule stretch while in sitting with L hip above knee, knee above ankle , to utilized 15 to 30 min intervals Instructed in door frame hamstring stretch from supine position with pt able to provide overpressure L ant knee at intervals Post leans on wall for heel rocks B to engage terminal quads   Game ready at end of session 10 min 34degrees with L Le elevated    PATIENT EDUCATION:  Education details: HEP update Person educated: Patient Education method: Programmer, multimedia,  Demonstration, and Handouts Education comprehension: verbalized understanding, returned demonstration, and needs further education  HOME EXERCISE PROGRAM: Access Code: [email or web address] URL: [email or web address] Date: 01/01/2024 Prepared by: Jonelle Bann April Erman Hayward  Exercises - Standing Terminal Knee Extension with Resistance  - 1 x daily - 7 x weekly - 2 sets - 10 reps - 3 sec hold - Long Sitting Quad Set with Towel Roll Under Heel  - 1 x daily - 7 x weekly - 2 sets - 10 reps - 3 sec hold - Prone Terminal Knee Extension  - 1 x daily - 7 x weekly - 2 sets - 10 reps - Prone Quadriceps Stretch with Strap  - 1 x daily - 7 x weekly - 2 sets - 2 reps - 30 sec hold - Prone Knee Extension Hang  - 1 x daily - 7 x weekly - 1 sets - 1 reps - 2 min hold - Quadruped Rocking Backward  - 1 x daily - 7 x weekly - 2 sets - 10 reps - Heel Sits  - 1 x daily - 7 x weekly - 2 sets - 10 reps - Standing Knee Flexion Stretch on  Step  - 1 x daily - 7 x weekly - 2 sets - 10 reps - Wall Squat  - 1 x daily - 7 x weekly - 2 sets - 10 reps - Seated Passive Knee Extension with Weight  - 1 x daily - 7 x weekly - 3 sets - 10 reps - Lateral Step Down  - 1 x daily - 7 x weekly - 3 sets - 10 reps - Forward Step Down Touch with Toe  - 1 x daily - 7 x weekly - 3 sets - 10 reps  ASSESSMENT:  CLINICAL IMPRESSION: Pt saw ortho who states that pt can be d/c-ed from PT. Pt states she feels ready for PT d/c. Provided final HEP for pt to continue to work on her knee ROM at home independently. Able to obtain 110 deg of knee flexion passively; however, actively pt is only able to obtain 105 deg. Extension remains limited passively and actively. Continue to encourage pt to stand and shift weight over to L LE. Pt has met or partially met her LTGs at this time.   From eval: Patient is a 48 y.o. F who was seen today for physical therapy evaluation and treatment for L TKR on 10/17/23. PMH significant for prior L knee surgery (did not  have full range) as well as L side sciatica. Assessment significant for decreased L knee ROM, poor quad activation, edema and L>R LE weakness leading to decreased balance, gait dysfunction, and transfers affecting home and community mobility. Pt will benefit from PT to address these deficits for return to her baseline function.   OBJECTIVE IMPAIRMENTS: Abnormal gait, decreased activity tolerance, decreased balance, decreased endurance, decreased mobility, difficulty walking, decreased ROM, decreased strength, hypomobility, increased edema, increased fascial restrictions, increased muscle spasms, impaired flexibility, impaired sensation, postural dysfunction, and pain.     GOALS: Goals reviewed with patient? Yes   SHORT TERM GOALS: Target date: 11/18/2023   Independent with initial HEP. Baseline: Newly issued on eval Goal status: MET- 10/28/23  2.  Pt will have improved L knee ROM from to 10-90 deg Baseline: 25-80 PROM, 40-75 AROM Goal status: MET- 11/07/23  3.  Pt will be able to wean to [facility] for home mobility utilizing reciprocal gait pattern Baseline: RW utilized with early step through pattern Goal status: MET - 11/18/23   LONG TERM GOALS: Target date: extended as of 12/19/23 8 weeks to to 02/13/24  Independent with advanced/ongoing HEP to improve outcomes and carryover.  Baseline:  Goal status: MET  2.  Dewayne Ford will demonstrate L knee flexion to 110 deg to ascend/descend stairs. Baseline: 80 deg AROM Goal status: PARTIALLY MET 01/01/24 -- PROM 115 deg, AROM 105 deg  3.  Dewayne Ford will demonstrate L knee extension = to R knee for safety with gait. Baseline: R knee = 5 deg from full ext, L knee = 25 deg from full ext Goal status: NOT MET 01/01/24 (See ROM above)  4.  Makenize Messman will be able to ambulate 1000' ind and normal gait pattern to access community/work.  Baseline: currently using RW Goal status: PARTIALLY MET 01/01/24 -- slightly flexed on L during stance  5.   Kimala Horne will be able to ascend/descend stairs with 1 HR and reciprocal step pattern safely to access home and community.  Baseline: not performing stairs IN PROGRESS- 12/12/23 decreased L eccentric control, uses momentum to ascend with L knee Goal status: PARTIALLY MET 01/01/24 -- no issues with ascending, decreased eccentric control at  steps >2 in height  6.  Francee Setzer will demonstrate > 19/24 on DGI to demonstrate decreased risk of falls.   Baseline: 12/24 Goal status: MET- 12/12/23  7.  Dewayne Ford will demonstrate improved functional LE strength by completing 5x STS in <10 seconds.  (MCID 5 seconds) Baseline: 15.79 sec Goal status: MET- 12/19/23 8 seconds  8.  Nayeli Calvert will have improved LEFS to >/=15/80 to demo MCID Baseline: 6/80 Goal status: MET- 12/19/23   PLAN:  PT FREQUENCY: 2x/week  PT DURATION: 8 weeks  PLANNED INTERVENTIONS: 97164- PT Re-evaluation, 97110-Therapeutic exercises, 97530- Therapeutic activity, 97112- Neuromuscular re-education, 97535- Self Care, 16109- Manual therapy, [phone]- Gait training, [phone]- Aquatic Therapy, [phone]- Electrical stimulation (unattended), 97035- Ultrasound, 29562- Ionotophoresis 4mg /ml Dexamethasone, Patient/Family education, Balance training, Stair training, Taping, Dry Needling, Joint mobilization, Spinal mobilization, Cryotherapy, and Moist heat  PLAN FOR NEXT SESSION: Knee ROM, L LE strengthening. Gait training   Fergus Throne April Ma L Karesha Trzcinski, PT, DPT 01/01/2024, 2:49 PM

## 2024-01-06 ENCOUNTER — Encounter: Admitting: Physical Therapy

## 2024-01-15 ENCOUNTER — Encounter: Payer: Self-pay | Admitting: Internal Medicine

## 2024-01-15 ENCOUNTER — Ambulatory Visit (INDEPENDENT_AMBULATORY_CARE_PROVIDER_SITE_OTHER): Admitting: Internal Medicine

## 2024-01-15 VITALS — BP 120/70 | HR 92 | Temp 98.8°F | Ht 66.5 in | Wt 191.8 lb

## 2024-01-15 DIAGNOSIS — H6690 Otitis media, unspecified, unspecified ear: Secondary | ICD-10-CM

## 2024-01-15 DIAGNOSIS — J00 Acute nasopharyngitis [common cold]: Secondary | ICD-10-CM | POA: Diagnosis not present

## 2024-01-15 MED ORDER — AMOXICILLIN-POT CLAVULANATE 875-125 MG PO TABS: 1.0000 | ORAL_TABLET | Freq: Two times a day (BID) | ORAL | 0 refills | Status: DC

## 2024-01-15 NOTE — Progress Notes (Unsigned)
 [facility] [address] [place] [address] Dept: [phone] Dept Fax: [phone]  Acute Care Office Visit  Subjective:   Gabriela Bean [birth year] 01/15/2024  Chief Complaint  Patient presents with   Sore Throat    Started Sunday     HPI:  URI SYMPTOMS: Gabriela Bean is a 48 yo F who complains of sore throat radiating to R. Ear. Associated nasal congestion.   Onset: 4 days ago   Fever: no Runny nose: intermittently  Nasal congestion: yes  Sinus pressure: slight Cough: no  Ear pain: yes Sore throat: yes  Treatments tried: none currently. Takes Claritin and Flonase for allergies   Recent sick contacts: Husband - bilateral ear infection      The following portions of the patient's history were reviewed and updated as appropriate: past medical history, past surgical history, family history, social history, allergies, medications, and problem list.   Patient Active Problem List   Diagnosis Date Noted   Prediabetes 10/09/2023   Anxiety disorder 04/11/2023   Primary osteoarthritis of both first carpometacarpal joints 08/05/2019   Genetic testing 01/27/2019   Family history of breast cancer    Family history of ovarian cancer    Family history of prostate cancer    Family history of BRCA2 gene positive    Pain in left knee 03/20/2018   ROM (rupture of membranes), premature 09/01/2012   Acute sinus infection 08/30/2012   Dyspnea and respiratory abnormality 08/30/2012   Allergic rhinitis 01/06/2009   Past Medical History:  Diagnosis Date   ALLERGIC RHINITIS 01/06/2009   Depression    Family history of BRCA2 gene positive    Family history of breast cancer    Family history of ovarian cancer    Family history of prostate cancer    Infertility, female    VBAC, delivered, current hospitalization 09/01/2012   Past Surgical History:  Procedure Laterality Date   CESAREAN SECTION  03/25/10   KNEE  SURGERY Left 2010   torn NCL   Family History  Problem Relation Age of Onset   Diabetes Mother    Hypertension Mother    Hypertension Father    Breast cancer Paternal Grandmother    Cancer Paternal Grandmother    Cancer Paternal Grandfather     Current Outpatient Medications:    amoxicillin -clavulanate (AUGMENTIN ) 875-125 MG tablet, Take 1 tablet by mouth 2 (two) times daily for 7 days., Disp: 14 tablet, Rfl: 0   escitalopram (LEXAPRO) 20 MG tablet, Take 1 tablet by mouth daily., Disp: , Rfl:    levonorgestrel (MIRENA, 52 MG,) 20 MCG/DAY IUD, Take 1 device every day by intrauterine route., Disp: , Rfl:    loratadine (CLARITIN) 10 MG tablet, Take by mouth., Disp: , Rfl:  Allergies  Allergen Reactions   Pseudoephedrine Other (See Comments)   Latex      ROS: A complete ROS was performed with pertinent positives/negatives noted in the HPI. The remainder of the ROS are negative.    Objective:   Today's Vitals   01/15/24 1440  BP: 120/70  Pulse: 92  Temp: 98.8 F (37.1 C)  TempSrc: Temporal  SpO2: 99%  Weight: 191 lb 12.8 oz (87 kg)  Height: 5' 6.5 (1.689 m)    GENERAL: Well-appearing, in NAD. Well nourished.  SKIN: Pink, warm and dry. No rash.  HEENT:    HEAD: Normocephalic, non-traumatic.  EYES: Conjunctive pink without exudate. PERRL.  EARS: External ear w/o redness, swelling, masses, or lesions. EAC  clear. Right TM: intact, slight bulging with redness. Left: TM intact, translucent w/o bulging, appropriate landmarks visualized.  NOSE: Septum midline w/o deformity. Nares patent, mucosa pink and inflamed w/o drainage. No sinus tenderness.  THROAT: Uvula midline. Oropharynx slightly erythematous. Tonsils non-inflamed w/o exudate . Mucus membranes pink and moist.  NECK: Trachea midline. Full ROM w/o pain or tenderness. No lymphadenopathy.  RESPIRATORY: Chest wall symmetrical. Respirations even and non-labored. Breath sounds clear to auscultation bilaterally.  CARDIAC: S1,  S2 present, regular rate and rhythm. Peripheral pulses 2+ bilaterally.  EXTREMITIES: Without clubbing, cyanosis, or edema.  NEUROLOGIC: Steady, even gait.  PSYCH/MENTAL STATUS: Alert, oriented x 3. Cooperative, appropriate mood and affect.    No results found for any visits on 01/15/24.    Assessment & Plan:  1. Acute nasopharyngitis (Primary) Switch Claritin to Zyrtec once daily  Can increase Flonase to 2 times a day  Sinus Rinses as needed   2. Acute otitis media, unspecified otitis media type - amoxicillin -clavulanate (AUGMENTIN ) 875-125 MG tablet; Take 1 tablet by mouth 2 (two) times daily for 7 days.  Dispense: 14 tablet; Refill: 0    Meds ordered this encounter  Medications   amoxicillin -clavulanate (AUGMENTIN ) 875-125 MG tablet    Sig: Take 1 tablet by mouth 2 (two) times daily for 7 days.    Dispense:  14 tablet    Refill:  0    Supervising Provider:   THOMPSON, AARON B [[id number]]   No orders of the defined types were placed in this encounter.  Lab Orders  No laboratory test(s) ordered today   No images are attached to the encounter or orders placed in the encounter.  Return if symptoms worsen or fail to improve.   Rosina Senters, FNP

## 2024-01-15 NOTE — Patient Instructions (Addendum)
 Switch Claritin to Zyrtec once daily  Can increase Flonase to 2 times a day  Sinus Rinses as needed   SINUS RINSE INSTRUCTIONS Step 1 Please wash your hands. Fill the clean bottle with the designated volume of warmed distilled, microfiltered (through 0.2 micron filter), reverse osmosis filtered, or commercially bottled water.  Step 2 Cut the Sinus RinseT mixture packet at the corner and pour its contents into the bottle. Tighten the cap and tube on the bottle securely. Place one finger over the tip of the cap and shake the bottle gently to dissolve the mixture. Always rinse your nasal passages with [employer] SINUS RINSE packets only.  Step 3 Standing in front of a sink, bend forward to your comfort level and tilt your head down. Keeping your mouth open, without holding your breath, place the cap snugly against your nasal passage. SQUEEZE BOTTLE GENTLY until the solution starts draining from the OPPOSITE nasal passage. Some may drain from your mouth. For a proper rinse, keep squeezing the bottle GENTLY until at least 1/4 to 1/2 (60 mL to 120 mL or 2 to 4 fl oz) of the bottle is used. Do not swallow the solution.  Step 4 Blow your nose very gently, without pinching nose completely to avoid pressure on eardrums. If tolerable, sniff in gently any residual solution remaining in the nasal passage once or twice, because this may clean out the posterior nasopharyngeal area, which is the area at the back of your nasal passage. At times, some solution will reach the back of your throat, so please spit it out. To help drain any residual solution, blow your nose gently while tilting your head forward and to the opposite side of the nasal passage you just rinsed.  Step 5 Now repeat steps 3 and 4 for your other nasal passage. Most users find that rinsing twice a day is beneficial, similar to brushing your teeth. Many doctors recommend rinsing 3-4 times daily or for special circumstances, even rinsing up to 6  times a day is safe. Please follow your physician's advice.  Step 6 Clean the bottle and cap. Air dry the Sinus RinseT bottle, cap, and tube on a clean paper towel, a lint free towel, or use [employer] NasaDOCK or NasaDOCK plusT (sold separately) to store the bottle, cap and tube

## 2024-01-22 ENCOUNTER — Encounter: Payer: Self-pay | Admitting: Internal Medicine

## 2024-01-22 DIAGNOSIS — H6690 Otitis media, unspecified, unspecified ear: Secondary | ICD-10-CM

## 2024-01-22 MED ORDER — CEFDINIR 300 MG PO CAPS: 300.0000 mg | ORAL_CAPSULE | Freq: Two times a day (BID) | ORAL | 0 refills | Status: AC

## 2024-02-22 ENCOUNTER — Encounter: Payer: Self-pay | Admitting: Internal Medicine

## 2024-02-24 ENCOUNTER — Ambulatory Visit: Admitting: Internal Medicine

## 2024-02-24 VITALS — BP 122/76 | HR 103 | Temp 97.6°F | Ht 66.5 in | Wt 200.6 lb

## 2024-02-24 DIAGNOSIS — L03113 Cellulitis of right upper limb: Secondary | ICD-10-CM

## 2024-02-24 DIAGNOSIS — W540XXA Bitten by dog, initial encounter: Secondary | ICD-10-CM

## 2024-02-24 MED ORDER — HYDROCODONE-ACETAMINOPHEN 5-325 MG PO TABS: 1.0000 | ORAL_TABLET | ORAL | 0 refills | Status: AC | PRN

## 2024-02-24 MED ORDER — AMOXICILLIN-POT CLAVULANATE 875-125 MG PO TABS: 1.0000 | ORAL_TABLET | Freq: Two times a day (BID) | ORAL | 0 refills | Status: AC

## 2024-02-24 NOTE — Telephone Encounter (Signed)
 Yes, needs appt. She is on schedule for 8/11

## 2024-02-24 NOTE — Progress Notes (Signed)
 [facility] [address] [place] [address] Dept: [phone] Dept Fax: [phone]  Acute Care Office Visit  Subjective:   Gabriela Bean 08/06/1975 02/24/2024  Chief Complaint  Patient presents with   Animal Bite    Happened saturdy night     HPI:  Discussed the use of AI scribe software for clinical note transcription with the patient, who gave verbal consent to proceed.  History of Present Illness   Gabriela Bean is a 48 year old female who presents with a dog bite on her right forearm.  She was bitten by her dog on Saturday, three days prior to this visit. The bite is located on her right forearm. She has been flushing the wound with sterile saline and using a special soap for cleaning. She reports there is some drainage from the deeper of the two wounds, but no foul smell.  She has been applying Neosporin to the wounds. She has noticed redness spreading on her forearm since the bite. The redness has increased since yesterday.  No numbness or tingling in her fingers, but she notes swelling in the area. She has had a tetanus shot within the last five years.    The following portions of the patient's history were reviewed and updated as appropriate: past medical history, past surgical history, family history, social history, allergies, medications, and problem list.   Patient Active Problem List   Diagnosis Date Noted   Prediabetes 10/09/2023   Anxiety disorder 04/11/2023   Primary osteoarthritis of both first carpometacarpal joints 08/05/2019   Genetic testing 01/27/2019   Family history of breast cancer    Family history of ovarian cancer    Family history of prostate cancer    Family history of BRCA2 gene positive    Pain in left knee 03/20/2018   ROM (rupture of membranes), premature 09/01/2012   Acute sinus infection 08/30/2012   Dyspnea and respiratory abnormality 08/30/2012   Allergic  rhinitis 01/06/2009   Past Medical History:  Diagnosis Date   ALLERGIC RHINITIS 01/06/2009   Depression    Family history of BRCA2 gene positive    Family history of breast cancer    Family history of ovarian cancer    Family history of prostate cancer    Infertility, female    VBAC, delivered, current hospitalization 09/01/2012   Past Surgical History:  Procedure Laterality Date   CESAREAN SECTION  03/25/10   KNEE SURGERY Left 2010   torn NCL   Family History  Problem Relation Age of Onset   Diabetes Mother    Hypertension Mother    Hypertension Father    Breast cancer Paternal Grandmother    Cancer Paternal Grandmother    Cancer Paternal Grandfather     Current Outpatient Medications:    amoxicillin -clavulanate (AUGMENTIN ) 875-125 MG tablet, Take 1 tablet by mouth 2 (two) times daily for 10 days., Disp: 20 tablet, Rfl: 0   escitalopram (LEXAPRO) 20 MG tablet, Take 1 tablet by mouth daily., Disp: , Rfl:    HYDROcodone -acetaminophen  (NORCO/VICODIN) 5-325 MG tablet, Take 1 tablet by mouth every 4 (four) hours as needed for up to 5 days for moderate pain (pain score 4-6)., Disp: 30 tablet, Rfl: 0   levonorgestrel (MIRENA, 52 MG,) 20 MCG/DAY IUD, Take 1 device every day by intrauterine route., Disp: , Rfl:    loratadine (CLARITIN) 10 MG tablet, Take by mouth., Disp: , Rfl:  Allergies  Allergen Reactions   Pseudoephedrine Other (See Comments)  Latex      ROS: A complete ROS was performed with pertinent positives/negatives noted in the HPI. The remainder of the ROS are negative.    Objective:   Today's Vitals   02/24/24 1550  BP: 122/76  Pulse: (!) 103  Temp: 97.6 F (36.4 C)  TempSrc: Temporal  SpO2: 98%  Weight: 200 lb 9.6 oz (91 kg)  Height: 5' 6.5 (1.689 m)    GENERAL: Well-appearing, in NAD. Well nourished.  SKIN:  1cm linear superficial wound to medial aspect of right forearm, 1.5cm partial thickness wound to lateral forearm with bloody purulence on bandage.  13cm x 14cm redness, swelling, warmth to anterior aspect to right forearm, extending towards posterior. Not circumferential.  RESPIRATORY: Chest wall symmetrical. Respirations even and non-labored.  CARDIAC:Peripheral pulses 2+ bilaterally.  MSK: Muscle tone and strength appropriate for age.  EXTREMITIES: No edema.  NEUROLOGIC: No motor or sensory deficits. Steady, even gait.  PSYCH/MENTAL STATUS: Alert, oriented x 3. Cooperative, appropriate mood and affect.    No results found for any visits on 02/24/24.    Assessment & Plan:  Assessment and Plan    Dog bite with cellulitis - Prescribed Augmentin  for 10 days. - Rx for pain control (Norco). PDMP reviewed.  - Instructed to clean wounds with soap and water. - Advised to apply Neosporin to affected areas. - Marked cellulitis area with skin marker to monitor for spreading. - Instructed to seek ER care if cellulitis worsens for potential IV antibiotics. - Scheduled follow-up appointment on Thursday, August 14 at 3 PM.      Meds ordered this encounter  Medications   amoxicillin -clavulanate (AUGMENTIN ) 875-125 MG tablet    Sig: Take 1 tablet by mouth 2 (two) times daily for 10 days.    Dispense:  20 tablet    Refill:  0    Supervising Provider:   THOMPSON, AARON B [[id number]]   HYDROcodone -acetaminophen  (NORCO/VICODIN) 5-325 MG tablet    Sig: Take 1 tablet by mouth every 4 (four) hours as needed for up to 5 days for moderate pain (pain score 4-6).    Dispense:  30 tablet    Refill:  0    Supervising Provider:   THOMPSON, AARON B [[id number]]   No orders of the defined types were placed in this encounter.  Lab Orders  No laboratory test(s) ordered today   No images are attached to the encounter or orders placed in the encounter.  Return in about 5 days (around 02/29/2024).   Rosina Senters, FNP

## 2024-02-27 ENCOUNTER — Ambulatory Visit: Admitting: Internal Medicine

## 2024-02-27 ENCOUNTER — Encounter: Payer: Self-pay | Admitting: Internal Medicine

## 2024-02-27 VITALS — BP 110/80 | HR 83 | Temp 98.4°F | Ht 66.5 in | Wt 202.2 lb

## 2024-02-27 DIAGNOSIS — W540XXD Bitten by dog, subsequent encounter: Secondary | ICD-10-CM

## 2024-02-27 DIAGNOSIS — L03113 Cellulitis of right upper limb: Secondary | ICD-10-CM | POA: Diagnosis not present

## 2024-02-27 DIAGNOSIS — S51851D Open bite of right forearm, subsequent encounter: Secondary | ICD-10-CM | POA: Diagnosis not present

## 2024-02-27 NOTE — Progress Notes (Signed)
 [facility] [address] [place] [address] Dept: [phone] Dept Fax: [phone]  Acute Care Office Visit  Subjective:   Gabriela Bean [birth year] 02/27/2024  Chief Complaint  Patient presents with   Follow-up    HPI:  Gabriela Bean is  a 48 yo F who presents for follow up of cellulitis of RUE due to dog bite. Patient was seen on 02/24/24 , placed on Augmentin  875-125mg  BID x 10 days. She has been keep area clean with soap and water. Since starting ABX, cellulitis has significantly improved . Denies  fever, chills, worsening swelling and redness to RUE.   The following portions of the patient's history were reviewed and updated as appropriate: past medical history, past surgical history, family history, social history, allergies, medications, and problem list.   Patient Active Problem List   Diagnosis Date Noted   Prediabetes 10/09/2023   Anxiety disorder 04/11/2023   Primary osteoarthritis of both first carpometacarpal joints 08/05/2019   Genetic testing 01/27/2019   Family history of breast cancer    Family history of ovarian cancer    Family history of prostate cancer    Family history of BRCA2 gene positive    Pain in left knee 03/20/2018   ROM (rupture of membranes), premature 09/01/2012   Acute sinus infection 08/30/2012   Dyspnea and respiratory abnormality 08/30/2012   Allergic rhinitis 01/06/2009   Past Medical History:  Diagnosis Date   ALLERGIC RHINITIS 01/06/2009   Depression    Family history of BRCA2 gene positive    Family history of breast cancer    Family history of ovarian cancer    Family history of prostate cancer    Infertility, female    VBAC, delivered, current hospitalization 09/01/2012   Past Surgical History:  Procedure Laterality Date   CESAREAN SECTION  03/25/10   KNEE SURGERY Left 2010   torn NCL   Family History  Problem Relation Age of Onset   Diabetes Mother     Hypertension Mother    Hypertension Father    Breast cancer Paternal Grandmother    Cancer Paternal Grandmother    Cancer Paternal Grandfather     Current Outpatient Medications:    amoxicillin -clavulanate (AUGMENTIN ) 875-125 MG tablet, Take 1 tablet by mouth 2 (two) times daily for 10 days., Disp: 20 tablet, Rfl: 0   escitalopram (LEXAPRO) 20 MG tablet, Take 1 tablet by mouth daily., Disp: , Rfl:    HYDROcodone -acetaminophen  (NORCO/VICODIN) 5-325 MG tablet, Take 1 tablet by mouth every 4 (four) hours as needed for up to 5 days for moderate pain (pain score 4-6)., Disp: 30 tablet, Rfl: 0   levonorgestrel (MIRENA, 52 MG,) 20 MCG/DAY IUD, Take 1 device every day by intrauterine route., Disp: , Rfl:    loratadine (CLARITIN) 10 MG tablet, Take by mouth., Disp: , Rfl:  Allergies  Allergen Reactions   Pseudoephedrine Other (See Comments)   Latex      ROS: A complete ROS was performed with pertinent positives/negatives noted in the HPI. The remainder of the ROS are negative.    Objective:   Today's Vitals   02/27/24 1514  BP: 110/80  Pulse: 83  Temp: 98.4 F (36.9 C)  TempSrc: Temporal  SpO2: 97%  Weight: 202 lb 3.2 oz (91.7 kg)  Height: 5' 6.5 (1.689 m)    GENERAL: Well-appearing, in NAD. Well nourished.  SKIN: Pink, warm and dry.  Approximated 4 cm x 1 cm area of localized redness to volar  aspect of right forearm.  1 cm linear superficial wound with scabbing to medial aspect of right forearm.  1.5 cm superficial wound to lateral forearm with minimal bloody drainage -improved from prior evaluation.  Mild swelling to proximal aspect of right forearm, ecchymosis surrounding puncture wounds.  RESPIRATORY: Chest wall symmetrical. Respirations even and non-labored. CARDIAC: S1, S2 present, regular rate and rhythm. Peripheral pulses 2+ bilaterally.  MSK: Muscle tone and strength appropriate for age. Joints w/o tenderness, redness, or swelling. EXTREMITIES: Without clubbing, cyanosis, or  edema.  NEUROLOGIC: No motor or sensory deficits. Steady, even gait.  PSYCH/MENTAL STATUS: Alert, oriented x 3. Cooperative, appropriate mood and affect.    No results found for any visits on 02/27/24.    Assessment & Plan:  1. Cellulitis of right upper extremity (Primary) - Greatly improved, continue Augmentin  twice daily x 10 days  2. Dog bite of right forearm, subsequent encounter -Continue soap and water to wound.  Neosporin and bandage.  No orders of the defined types were placed in this encounter.  No orders of the defined types were placed in this encounter.  Lab Orders  No laboratory test(s) ordered today   No images are attached to the encounter or orders placed in the encounter.  Return if symptoms worsen or fail to improve.   Rosina Senters, FNP

## 2024-04-14 ENCOUNTER — Encounter: Payer: 59 | Admitting: Internal Medicine

## 2024-05-13 ENCOUNTER — Encounter: Payer: Self-pay | Admitting: Internal Medicine

## 2024-05-13 ENCOUNTER — Ambulatory Visit (INDEPENDENT_AMBULATORY_CARE_PROVIDER_SITE_OTHER): Admitting: Internal Medicine

## 2024-05-13 VITALS — BP 110/80 | HR 82 | Temp 97.8°F | Ht 67.0 in | Wt 201.4 lb

## 2024-05-13 DIAGNOSIS — Z13220 Encounter for screening for lipoid disorders: Secondary | ICD-10-CM | POA: Diagnosis not present

## 2024-05-13 DIAGNOSIS — Z23 Encounter for immunization: Secondary | ICD-10-CM | POA: Diagnosis not present

## 2024-05-13 DIAGNOSIS — Z0001 Encounter for general adult medical examination with abnormal findings: Secondary | ICD-10-CM

## 2024-05-13 DIAGNOSIS — G4700 Insomnia, unspecified: Secondary | ICD-10-CM | POA: Diagnosis not present

## 2024-05-13 DIAGNOSIS — Z0000 Encounter for general adult medical examination without abnormal findings: Secondary | ICD-10-CM

## 2024-05-13 DIAGNOSIS — Z8632 Personal history of gestational diabetes: Secondary | ICD-10-CM | POA: Insufficient documentation

## 2024-05-13 DIAGNOSIS — Z131 Encounter for screening for diabetes mellitus: Secondary | ICD-10-CM

## 2024-05-13 LAB — CBC WITH DIFFERENTIAL/PLATELET
Basophils Absolute: 0 K/uL (ref 0.0–0.1)
Basophils Relative: 0.3 % (ref 0.0–3.0)
Eosinophils Absolute: 0.2 K/uL (ref 0.0–0.7)
Eosinophils Relative: 2.9 % (ref 0.0–5.0)
HCT: 40.8 % (ref 36.0–46.0)
Hemoglobin: 13.9 g/dL (ref 12.0–15.0)
Lymphocytes Relative: 17 % (ref 12.0–46.0)
Lymphs Abs: 1 K/uL (ref 0.7–4.0)
MCHC: 34 g/dL (ref 30.0–36.0)
MCV: 94.5 fl (ref 78.0–100.0)
Monocytes Absolute: 0.3 K/uL (ref 0.1–1.0)
Monocytes Relative: 5.8 % (ref 3.0–12.0)
Neutro Abs: 4.3 K/uL (ref 1.4–7.7)
Neutrophils Relative %: 74 % (ref 43.0–77.0)
Platelets: 264 K/uL (ref 150.0–400.0)
RBC: 4.32 Mil/uL (ref 3.87–5.11)
RDW: 11.8 % (ref 11.5–15.5)
WBC: 5.8 K/uL (ref 4.0–10.5)

## 2024-05-13 LAB — COMPREHENSIVE METABOLIC PANEL WITH GFR
ALT: 40 U/L — ABNORMAL HIGH (ref 0–35)
AST: 33 U/L (ref 0–37)
Albumin: 4.2 g/dL (ref 3.5–5.2)
Alkaline Phosphatase: 50 U/L (ref 39–117)
BUN: 11 mg/dL (ref 6–23)
CO2: 26 meq/L (ref 19–32)
Calcium: 9.1 mg/dL (ref 8.4–10.5)
Chloride: 103 meq/L (ref 96–112)
Creatinine, Ser: 0.53 mg/dL (ref 0.40–1.20)
GFR: 109.8 mL/min (ref >60.00)
Glucose, Bld: 128 mg/dL — ABNORMAL HIGH (ref 70–99)
Potassium: 4.3 meq/L (ref 3.5–5.1)
Sodium: 135 meq/L (ref 135–145)
Total Bilirubin: 0.8 mg/dL (ref 0.2–1.2)
Total Protein: 6.8 g/dL (ref 6.0–8.3)

## 2024-05-13 LAB — LIPID PANEL
Cholesterol: 162 mg/dL (ref 0–200)
HDL: 42.8 mg/dL (ref >39.00)
LDL Cholesterol: 95 mg/dL (ref 0–99)
NonHDL: 119.1
Total CHOL/HDL Ratio: 4
Triglycerides: 123 mg/dL (ref 0.0–149.0)
VLDL: 24.6 mg/dL (ref 0.0–40.0)

## 2024-05-13 LAB — TSH: TSH: 1.79 u[IU]/mL (ref 0.35–5.50)

## 2024-05-13 LAB — HEMOGLOBIN A1C: Hgb A1c MFr Bld: 5.9 % (ref 4.6–6.5)

## 2024-05-13 MED ORDER — TRAZODONE HCL 100 MG PO TABS: ORAL_TABLET | ORAL | 1 refills | Status: AC

## 2024-05-13 NOTE — Progress Notes (Signed)
 Subjective:   Gabriela Bean 1976-06-10  05/13/2024   CC: Chief Complaint  Patient presents with   Annual Exam    No concerns pt had black coffee    HPI: Gabriela Bean is a 48 y.o. female who presents for a routine health maintenance exam.  Labs  collected at time of visit.   Discussed the use of AI scribe software for clinical note transcription with the patient, who gave verbal consent to proceed.  History of Present Illness   Gabriela Bean is a 48 year old female who presents for an annual wellness exam.  She is fasting today for her blood work. She had a colonoscopy last year with small polyps found. Her mammogram is scheduled for December 24th, and her pap smear is up to date, not due until next year. She has received her tetanus booster.  She is a former smoker, having quit in 2007, and consumes alcohol three to four times a week, typically wine. She is sexually active. She tries to maintain a healthy diet but finds exercise challenging due to her knee surgery and recent weather conditions.  Her fasting blood sugar was previously high, and her A1c was in the prediabetes range in March. She has a history of gestational diabetes with her son, and her mother is prediabetic on metformin.  She experiences trouble sleeping, which she attributes to her knee pain. She has tried melatonin, Benadryl , magnesium, and ashwagandha without success. She has previously tried Ambien  but did not like it.    HEALTH SCREENINGS: - Pap smear: up to date - Mammogram (40+): scheduled for December 24th   - Colonoscopy (45+): Up to date - [facility] - requesting records.  - Bone Density (65+): Not applicable  - Lung CA screening with low-dose CT:  Not applicable Adults age 47-80 who are current cigarette smokers or quit within the last 15 years. Must have 20 pack year history.   IMMUNIZATIONS: - Tdap: Tetanus vaccination status reviewed: last tetanus booster within 10  years. - HPV: Not applicable - Influenza: Administered today   Past medical history, surgical history, medications, allergies, family history and social history reviewed with patient today and changes made to appropriate areas of the chart.   Social History   Socioeconomic History   Marital status: Married    Spouse name: Not on file   Number of children: Not on file   Years of education: Not on file   Highest education level: Master's degree (e.g., MA, MS, MEng, MEd, MSW, MBA)  Occupational History   Not on file  Tobacco Use   Smoking status: Former    Current packs/day: 0.00    Types: Cigarettes    Quit date: 07/16/2005    Years since quitting: 18.8   Smokeless tobacco: Not on file  Vaping Use   Vaping status: Never Used  Substance and Sexual Activity   Alcohol use: Yes    Alcohol/week: 9.0 standard drinks of alcohol    Types: 9 Glasses of wine per week   Drug use: No   Sexual activity: Yes    Birth control/protection: I.U.D.  Other Topics Concern   Not on file  Social History Narrative   Not on file   Social Drivers of Health   Financial Resource Strain: Low Risk  (02/24/2024)   Overall Financial Resource Strain (CARDIA)    Difficulty of Paying Living Expenses: Not hard at all  Food Insecurity: No Food Insecurity (02/24/2024)   Hunger Vital Sign  Worried About Programme Researcher, Broadcasting/film/video in the Last Year: Never true    Ran Out of Food in the Last Year: Never true  Transportation Needs: No Transportation Needs (02/24/2024)   PRAPARE - Administrator, Civil Service (Medical): No    Lack of Transportation (Non-Medical): No  Physical Activity: Insufficiently Active (02/24/2024)   Exercise Vital Sign    Days of Exercise per Week: 4 days    Minutes of Exercise per Session: 20 min  Stress: Stress Concern Present (02/24/2024)   [name] of Occupational Health - Occupational Stress Questionnaire    Feeling of Stress: Rather much  Social Connections: Socially  Integrated (02/24/2024)   Social Connection and Isolation Panel    Frequency of Communication with Friends and Family: More than three times a week    Frequency of Social Gatherings with Friends and Family: More than three times a week    Attends Religious Services: More than 4 times per year    Active Member of [employer] or Organizations: Yes    Attends Banker Meetings: Patient declined    Marital Status: Married  Catering Manager Violence: Not on file     Past Medical History:  Diagnosis Date   ALLERGIC RHINITIS 01/06/2009   Depression    Family history of BRCA2 gene positive    Family history of breast cancer    Family history of ovarian cancer    Family history of prostate cancer    Infertility, female    VBAC, delivered, current hospitalization 09/01/2012    Past Surgical History:  Procedure Laterality Date   CESAREAN SECTION  03/25/10   KNEE SURGERY Left 2010   torn NCL    Current Outpatient Medications on File Prior to Visit  Medication Sig   escitalopram (LEXAPRO) 10 MG tablet Lexapro   levonorgestrel (MIRENA, 52 MG,) 20 MCG/DAY IUD Take 1 device every day by intrauterine route.   Loratadine (CLARITIN PO) Claritin   Omega-3 Fatty Acids (OMEGA 3 500 PO) Omega 3   Fluticasone Propionate (FLONASE ALLERGY RELIEF NA) FLONASE NS   No current facility-administered medications on file prior to visit.    Allergies  Allergen Reactions   Pseudoephedrine Other (See Comments)   Latex     Family History  Problem Relation Age of Onset   Diabetes Mother    Hypertension Mother    Hypertension Father    Breast cancer Paternal Grandmother    Cancer Paternal Grandmother    Cancer Paternal Grandfather      ROS: Denies fever, fatigue, unexplained weight loss/gain, hearing or vision changes, cardiac or respiratory complaints. Denies neurological deficits, musculoskeletal complaints, gastrointestinal or genitourinary complaints, mental health complaints, and skin  changes.   Objective:   Today's Vitals   05/13/24 0917  BP: 110/80  Pulse: 82  Temp: 97.8 F (36.6 C)  TempSrc: Temporal  SpO2: 99%  Weight: 201 lb 6.4 oz (91.4 kg)  Height: 5' 7 (1.702 m)    GENERAL APPEARANCE: Well-appearing, in NAD. Well nourished.  SKIN: Pink, warm and dry. Turgor normal. No rash, lesion, ulceration, or ecchymoses. Hair evenly distributed.  HEENT: HEAD: Normocephalic.  EYES: PERRLA. EOMI. Lids intact w/o defect. Sclera white, Conjunctiva pink w/o exudate.  EARS: External ear w/o redness, swelling, masses or lesions. EAC clear. TM's intact, translucent w/o bulging, appropriate landmarks visualized. Appropriate acuity to conversational tones.  NOSE: Septum midline w/o deformity. Nares patent, mucosa pink and non-inflamed w/o drainage.  THROAT: Uvula midline. Oropharynx clear. Tonsils non-inflamed  w/o exudate. Oral mucosa pink and moist.  NECK: Supple, Trachea midline. Full ROM w/o pain or tenderness. No lymphadenopathy. Thyroid non-tender w/o enlargement or palpable masses.  RESPIRATORY: Chest wall symmetrical w/o masses. Respirations even and non-labored. Breath sounds clear to auscultation bilaterally. No wheezes, rales, rhonchi, or crackles. CARDIAC: S1, S2 present, regular rate and rhythm. No gallops, murmurs, rubs, or clicks. Capillary refill <2 seconds. Peripheral pulses 2+ bilaterally. GI: Abdomen soft w/o distention. Normoactive bowel sounds. No palpable masses or tenderness. No guarding or rebound tenderness. Liver and spleen w/o tenderness or enlargement. No CVA tenderness.  MSK: Muscle tone and strength appropriate for age, w/o atrophy or abnormal movement.  EXTREMITIES: Active ROM intact, w/o tenderness, crepitus, or contracture. No obvious joint deformities or effusions. No clubbing, edema, or cyanosis.  NEUROLOGIC: CN's II-XII intact. Motor strength symmetrical with no obvious weakness. No sensory deficits. Steady, even gait.  PSYCH/MENTAL STATUS:  Alert, oriented x 3. Cooperative, appropriate mood and affect.    Depression and Anxiety Screen done today and results listed below:     05/13/2024    9:52 AM 02/24/2024    3:50 PM 10/08/2023   10:18 AM 07/02/2023    8:06 AM 04/11/2023    8:36 AM  Depression screen PHQ 2/9  Decreased Interest 0 0 0 0 1  Down, Depressed, Hopeless 0 0 0 0 1  PHQ - 2 Score 0 0 0 0 2  Altered sleeping 2    1  Tired, decreased energy 2    1  Change in appetite 1    0  Feeling bad or failure about yourself  0    0  Trouble concentrating 0    1  Moving slowly or fidgety/restless 0    0  Suicidal thoughts 0    0  PHQ-9 Score 5    5  Difficult doing work/chores Not difficult at all    Not difficult at all      05/13/2024    9:52 AM 04/11/2023    8:37 AM  GAD 7 : Generalized Anxiety Score  Nervous, Anxious, on Edge 2 1  Control/stop worrying 1 1  Worry too much - different things 1 1  Trouble relaxing 1 1  Restless 0 1  Easily annoyed or irritable 3 2  Afraid - awful might happen 1 0  Total GAD 7 Score 9 7  Anxiety Difficulty Not difficult at all Somewhat difficult     Results for orders placed or performed in visit on 10/09/23  Hemoglobin A1C   Collection Time: 10/09/23 10:24 AM  Result Value Ref Range   Hgb A1c MFr Bld 5.7 4.6 - 6.5 %    Assessment & Plan:  Encounter for general adult medical examination without abnormal findings -     CBC with Differential/Platelet -     Comprehensive metabolic panel with GFR -     TSH -     Lipid panel -     Hemoglobin A1c  Immunization due -     Flu vaccine trivalent PF, 6mos and older(Flulaval,Afluria,Fluarix,Fluzone)  Insomnia, unspecified type -     traZODone HCl; Take 1/2 to 1 tablet at bedtime as needed for sleep.  Dispense: 90 tablet; Refill: 1    Orders Placed This Encounter  Procedures   Flu vaccine trivalent PF, 6mos and older(Flulaval,Afluria,Fluarix,Fluzone)   CBC with Differential/Platelet   Comprehensive metabolic panel with  GFR   TSH   Lipid panel   Hemoglobin A1C    PATIENT  COUNSELING:  - Encouraged a healthy well-balanced diet. Patient may adjust caloric intake to maintain or achieve ideal body weight. May reduce intake of dietary saturated fat and total fat and have adequate dietary potassium and calcium preferably from fresh fruits, vegetables, and low-fat dairy products.   - Advised to avoid cigarette smoking. - Discussed with the patient that most people either abstain from alcohol or drink within safe limits (<=14/week and <=4 drinks/occasion for males, <=7/weeks and <= 3 drinks/occasion for females) and that the risk for alcohol disorders and other health effects rises proportionally with the number of drinks per week and how often a drinker exceeds daily limits. - Discussed cessation/primary prevention of drug use and availability of treatment for abuse.  - Discussed sexually transmitted diseases, avoidance of unintended pregnancy and contraceptive alternatives.  - Stressed the importance of regular exercise  NEXT PREVENTATIVE PHYSICAL DUE IN 1 YEAR.  Return in about 1 year (around 05/13/2025) for Annual Physical Exam with fasting lab work.  Rosina Senters, FNP

## 2024-05-19 ENCOUNTER — Ambulatory Visit: Payer: Self-pay | Admitting: Internal Medicine

## 2024-05-19 DIAGNOSIS — R7401 Elevation of levels of liver transaminase levels: Secondary | ICD-10-CM

## 2024-06-23 ENCOUNTER — Encounter: Payer: Self-pay | Admitting: Internal Medicine

## 2024-06-24 NOTE — Telephone Encounter (Signed)
 LVM for pt to schedule appointment to been seen.

## 2024-07-02 ENCOUNTER — Telehealth: Admitting: Internal Medicine

## 2024-07-02 VITALS — Ht 67.0 in | Wt 201.0 lb

## 2024-07-02 DIAGNOSIS — J0140 Acute pansinusitis, unspecified: Secondary | ICD-10-CM | POA: Diagnosis not present

## 2024-07-02 MED ORDER — AMOXICILLIN-POT CLAVULANATE 875-125 MG PO TABS: 1.0000 | ORAL_TABLET | Freq: Two times a day (BID) | ORAL | 0 refills | Status: AC

## 2024-07-02 NOTE — Progress Notes (Unsigned)
 [facility] [address] [place] [address] Dept: [phone] Dept Fax: [phone]  Virtual Video Visit  I connected with Gabriela Bean on 07/02/2024 at  2:40 PM EST by a video enabled telemedicine application and verified that I am speaking with the correct person using two identifiers.   Location patient: Home Location provider: Clinic Total time: 5  minutes Persons participating in the virtual visit: Patient; Gabriela Bean CMA; Gabriela Senters, FNP-C  I discussed the limitations of evaluation and management by telemedicine and the availability of in-person appointments. The patient expressed understanding and agreed to proceed.  Chief Complaint  Patient presents with   Cough    Sore throat, body aches body chills, ear pain    SUBJECTIVE:  HPI:    The following portions of the patient's history were reviewed and updated as appropriate: medical history, surgical history, medications, allergies, social history, and family history.    Past Medical History:  Diagnosis Date   ALLERGIC RHINITIS 01/06/2009   Depression    Family history of BRCA2 gene positive    Family history of breast cancer    Family history of ovarian cancer    Family history of prostate cancer    Infertility, female    VBAC, delivered, current hospitalization 09/01/2012   Past Surgical History:  Procedure Laterality Date   CESAREAN SECTION  03/25/10   KNEE SURGERY Left 2010   torn NCL    Current Medications[1] Allergies[2]  Social History   Socioeconomic History   Marital status: Married    Spouse name: Not on file   Number of children: Not on file   Years of education: Not on file   Highest education level: Master's degree (e.g., MA, MS, MEng, MEd, MSW, MBA)  Occupational History   Not on file  Tobacco Use   Smoking status: Former    Current packs/day: 0.00    Types: Cigarettes    Quit date: 07/16/2005    Years since quitting:  18.9   Smokeless tobacco: Not on file  Vaping Use   Vaping status: Never Used  Substance and Sexual Activity   Alcohol use: Yes    Alcohol/week: 9.0 standard drinks of alcohol    Types: 9 Glasses of wine per week   Drug use: No   Sexual activity: Yes    Birth control/protection: I.U.D.  Other Topics Concern   Not on file  Social History Narrative   Not on file   Social Drivers of Health   Tobacco Use: Medium Risk (07/02/2024)   Patient History    Smoking Tobacco Use: Former    Smokeless Tobacco Use: Unknown    Passive Exposure: Not on file  Financial Resource Strain: Low Risk (07/02/2024)   Overall Financial Resource Strain (CARDIA)    Difficulty of Paying Living Expenses: Not hard at all  Food Insecurity: No Food Insecurity (07/02/2024)   Epic    Worried About Radiation Protection Practitioner of Food in the Last Year: Never true    Ran Out of Food in the Last Year: Never true  Transportation Needs: No Transportation Needs (07/02/2024)   Epic    Lack of Transportation (Medical): No    Lack of Transportation (Non-Medical): No  Physical Activity: Insufficiently Active (07/02/2024)   Exercise Vital Sign    Days of Exercise per Week: 3 days    Minutes of Exercise per Session: 30 min  Stress: Stress Concern Present (07/02/2024)   [name] of Occupational Health - Occupational Stress  Questionnaire    Feeling of Stress: Very much  Social Connections: Socially Integrated (07/02/2024)   Social Connection and Isolation Panel    Frequency of Communication with Friends and Family: Twice a week    Frequency of Social Gatherings with Friends and Family: Once a week    Attends Religious Services: More than 4 times per year    Active Member of Clubs or Organizations: Yes    Attends Banker Meetings: 1 to 4 times per year    Marital Status: Married  Catering Manager Violence: Not on file  Depression (PHQ2-9): Low Risk (07/02/2024)   Depression (PHQ2-9)    PHQ-2 Score: 0  Recent  Concern: Depression (PHQ2-9) - Medium Risk (05/13/2024)   Depression (PHQ2-9)    PHQ-2 Score: 5  Alcohol Screen: Low Risk (07/02/2024)   Alcohol Screen    Last Alcohol Screening Score (AUDIT): 4  Housing: Low Risk (07/02/2024)   Epic    Unable to Pay for Housing in the Last Year: No    Number of Times Moved in the Last Year: 0    Homeless in the Last Year: No  Utilities: Not on file  Health Literacy: Not on file    Family History  Problem Relation Age of Onset   Diabetes Mother    Hypertension Mother    Hypertension Father    Breast cancer Paternal Grandmother    Cancer Paternal Grandmother    Cancer Paternal Grandfather      ROS: A complete ROS was performed with pertinent positives/negatives noted in the HPI. The remainder of the ROS are negative.    OBJECTIVE:  VITALS per patient if applicable: Today's Vitals   07/02/24 1447  Weight: 201 lb (91.2 kg)  Height: 5' 7 (1.702 m)   Body mass index is 31.48 kg/m.   GENERAL: Alert and oriented. Appears well and in no acute distress. HEENT: Atraumatic. Conjunctiva clear. No obvious abnormalities on inspection of external nose and ears. NECK: Normal movements of the head and neck. LUNGS: On inspection, no signs of respiratory distress. Breathing rate appears normal. No obvious gross SOB, gasping or wheezing, and no conversational dyspnea. CV: No obvious cyanosis. MS: Moves all visible extremities without noticeable abnormality. PSYCH/NEURO: Pleasant and cooperative. No obvious depression or anxiety. Speech and thought processing grossly intact.  ASSESSMENT AND PLAN: ***   I discussed the assessment and treatment plan with the patient. The patient was provided an opportunity to ask questions and all were answered. The patient agreed with the plan and demonstrated an understanding of the instructions.   The patient was advised to call back or seek an in-person evaluation if the symptoms worsen or if the condition fails  to improve as anticipated.  No follow-ups on file.  Gabriela Senters, FNP      [1]  Current Outpatient Medications:    escitalopram (LEXAPRO) 10 MG tablet, Lexapro, Disp: , Rfl:    Fluticasone Propionate (FLONASE ALLERGY RELIEF NA), FLONASE NS, Disp: , Rfl:    levonorgestrel (MIRENA, 52 MG,) 20 MCG/DAY IUD, Take 1 device every day by intrauterine route., Disp: , Rfl:    Loratadine (CLARITIN PO), Claritin, Disp: , Rfl:    Omega-3 Fatty Acids (OMEGA 3 500 PO), Omega 3, Disp: , Rfl:    traZODone  (DESYREL ) 100 MG tablet, Take 1/2 to 1 tablet at bedtime as needed for sleep., Disp: 90 tablet, Rfl: 1 [2]  Allergies Allergen Reactions   Pseudoephedrine Other (See Comments) and Palpitations    pseudoephedrine  Latex Rash
